# Patient Record
Sex: Female | Born: 1950
Health system: Southern US, Community
[De-identification: ages and names within clinical notes are randomized; demographics above are authoritative.]

## PROBLEM LIST (undated history)

## (undated) DIAGNOSIS — I10 Essential (primary) hypertension: Secondary | ICD-10-CM

## (undated) DIAGNOSIS — M1990 Unspecified osteoarthritis, unspecified site: Secondary | ICD-10-CM

## (undated) DIAGNOSIS — E785 Hyperlipidemia, unspecified: Secondary | ICD-10-CM

## (undated) DIAGNOSIS — R0600 Dyspnea, unspecified: Secondary | ICD-10-CM

## (undated) DIAGNOSIS — E079 Disorder of thyroid, unspecified: Secondary | ICD-10-CM

## (undated) DIAGNOSIS — E039 Hypothyroidism, unspecified: Secondary | ICD-10-CM

## (undated) HISTORY — DX: Disorder of thyroid, unspecified: E07.9

## (undated) HISTORY — DX: Hyperlipidemia, unspecified: E78.5

## (undated) HISTORY — DX: Essential (primary) hypertension: I10

---

## 1976-07-08 HISTORY — PX: ABDOMINAL HYSTERECTOMY: SHX81

## 2005-08-15 LAB — HM DEXA SCAN

## 2006-05-25 ENCOUNTER — Emergency Department: Payer: Self-pay | Admitting: Emergency Medicine

## 2007-02-01 ENCOUNTER — Emergency Department: Payer: Self-pay | Admitting: Emergency Medicine

## 2010-08-21 LAB — HM PAP SMEAR

## 2014-09-06 DIAGNOSIS — L719 Rosacea, unspecified: Secondary | ICD-10-CM | POA: Diagnosis not present

## 2014-09-06 DIAGNOSIS — I10 Essential (primary) hypertension: Secondary | ICD-10-CM | POA: Diagnosis not present

## 2015-03-21 ENCOUNTER — Telehealth: Payer: Self-pay | Admitting: Unknown Physician Specialty

## 2015-03-21 NOTE — Telephone Encounter (Signed)
Pt called, heard it wasn't good to take Aleve with her blood pressure pills.  Would like a call back about this.

## 2015-03-21 NOTE — Telephone Encounter (Signed)
Routing to provider. What can I tell the patient about this?

## 2015-03-22 NOTE — Telephone Encounter (Signed)
Alyssa Bryant is out of the office You can let her know that she has heard correctly; NSAIDs like Aleve can increase blood pressure Tylenol (acetaminophen) does not affect blood pressure and can be take safely per package directions as long as she doesn't have liver disease or drink more than 7 alcoholic drinks per week

## 2015-03-23 NOTE — Telephone Encounter (Signed)
Called and spoke to a man who said Joetta was not at home. I asked him to please have her give me a call back.

## 2015-03-29 NOTE — Telephone Encounter (Signed)
Tried to call patient. Phone rang but then stopped and no voicemail was available. Will try again later.

## 2015-03-30 NOTE — Telephone Encounter (Signed)
Tried to call patient but the phone just kept ringing. No voicemail was available.

## 2015-04-18 ENCOUNTER — Other Ambulatory Visit: Payer: Self-pay

## 2015-04-18 MED ORDER — LEVOTHYROXINE SODIUM 100 MCG PO TABS: 100.0000 ug | ORAL_TABLET | Freq: Every day | ORAL | Status: DC

## 2015-04-18 MED ORDER — LISINOPRIL-HYDROCHLOROTHIAZIDE 20-12.5 MG PO TABS: 2.0000 | ORAL_TABLET | Freq: Every day | ORAL | Status: DC

## 2015-04-18 NOTE — Telephone Encounter (Signed)
Patient was last seen 09/06/14, practice partner number is [phone], and pharmacy is [facility] on [address].

## 2015-04-18 NOTE — Telephone Encounter (Signed)
Needs seen further refills 

## 2015-04-19 NOTE — Telephone Encounter (Signed)
Tried to call patient but there was no answer and no voicemail available. Will try again later.

## 2015-04-19 NOTE — Telephone Encounter (Signed)
Tried to call patient and phone rang but then stopped. So I will try to call again later.

## 2015-04-24 NOTE — Telephone Encounter (Signed)
Tried to call patient twice and the phone rings a couple of times but then stops. No voicemail available.

## 2015-06-22 ENCOUNTER — Other Ambulatory Visit: Payer: Self-pay | Admitting: Unknown Physician Specialty

## 2015-06-22 NOTE — Telephone Encounter (Signed)
Pt needs check further refills 

## 2015-07-25 ENCOUNTER — Other Ambulatory Visit: Payer: Self-pay | Admitting: Unknown Physician Specialty

## 2015-08-29 ENCOUNTER — Other Ambulatory Visit: Payer: Self-pay | Admitting: Unknown Physician Specialty

## 2015-08-30 ENCOUNTER — Other Ambulatory Visit: Payer: Self-pay | Admitting: Unknown Physician Specialty

## 2015-08-31 ENCOUNTER — Other Ambulatory Visit: Payer: Self-pay

## 2015-08-31 NOTE — Telephone Encounter (Signed)
Pt's husband made appt for her 09/06/15 @ 3:30.  Would like meds called in to pharmacy on file.

## 2015-08-31 NOTE — Telephone Encounter (Signed)
Called and left patient a voicemail asking for her to please return my call.  

## 2015-08-31 NOTE — Telephone Encounter (Signed)
Patient's husband called asking about patient's medication refills. He is on HIPPA so I let him know that she needed to make an appointment in order to get more refills. He stated she was at work but gave me a phone number to call her at. So I will call and try to schedule the patient an appointment.

## 2015-09-01 MED ORDER — LISINOPRIL-HYDROCHLOROTHIAZIDE 20-12.5 MG PO TABS: 2.0000 | ORAL_TABLET | Freq: Every day | ORAL | Status: DC

## 2015-09-01 MED ORDER — LEVOTHYROXINE SODIUM 100 MCG PO TABS: 100.0000 ug | ORAL_TABLET | Freq: Every day | ORAL | Status: DC

## 2015-09-01 NOTE — Telephone Encounter (Signed)
Routing to provider  

## 2015-09-05 DIAGNOSIS — E039 Hypothyroidism, unspecified: Secondary | ICD-10-CM | POA: Insufficient documentation

## 2015-09-05 DIAGNOSIS — I10 Essential (primary) hypertension: Secondary | ICD-10-CM | POA: Insufficient documentation

## 2015-09-05 DIAGNOSIS — M8580 Other specified disorders of bone density and structure, unspecified site: Secondary | ICD-10-CM | POA: Insufficient documentation

## 2015-09-05 DIAGNOSIS — E782 Mixed hyperlipidemia: Secondary | ICD-10-CM | POA: Insufficient documentation

## 2015-09-06 ENCOUNTER — Ambulatory Visit: Payer: Self-pay | Admitting: Unknown Physician Specialty

## 2015-09-12 ENCOUNTER — Ambulatory Visit (INDEPENDENT_AMBULATORY_CARE_PROVIDER_SITE_OTHER): Payer: Medicare Other | Admitting: Unknown Physician Specialty

## 2015-09-12 ENCOUNTER — Encounter: Payer: Self-pay | Admitting: Unknown Physician Specialty

## 2015-09-12 VITALS — BP 129/70 | HR 62 | Temp 97.9°F | Ht 62.0 in | Wt 188.6 lb

## 2015-09-12 DIAGNOSIS — E038 Other specified hypothyroidism: Secondary | ICD-10-CM

## 2015-09-12 DIAGNOSIS — Z23 Encounter for immunization: Secondary | ICD-10-CM | POA: Diagnosis not present

## 2015-09-12 DIAGNOSIS — I10 Essential (primary) hypertension: Secondary | ICD-10-CM

## 2015-09-12 LAB — MICROALBUMIN, URINE WAIVED
CREATININE, URINE WAIVED: 200 mg/dL (ref 10–300)
MICROALB, UR WAIVED: 30 mg/L — AB (ref 0–19)

## 2015-09-12 MED ORDER — LISINOPRIL-HYDROCHLOROTHIAZIDE 20-12.5 MG PO TABS: 2.0000 | ORAL_TABLET | Freq: Every day | ORAL | Status: DC

## 2015-09-12 MED ORDER — LEVOTHYROXINE SODIUM 100 MCG PO TABS: 100.0000 ug | ORAL_TABLET | Freq: Every day | ORAL | Status: DC

## 2015-09-12 NOTE — Assessment & Plan Note (Signed)
Stable, continue present medications.  Check TSH 

## 2015-09-12 NOTE — Progress Notes (Signed)
   BP 129/70 mmHg  Pulse 62  Temp(Src) 97.9 F (36.6 C)  Ht 5\' 2"  (1.575 m)  Wt 188 lb 9.6 oz (85.548 kg)  BMI 34.49 kg/m2  SpO2 97%  LMP  (LMP Unknown)   Subjective:    Patient ID: Alyssa Bryant, female    DOB: 1950/08/31, 65 y.o.   MRN: [id number]  HPI: Alyssa Bryant is a 65 y.o. female  Chief Complaint  Patient presents with  . Hypertension  . Hypothyroidism   Hypertension Using medications without difficulty Average home BP Not checking  No problems or lightheadedness No chest pain with exertion or shortness of breath No Edema  Hypothyroid No weight changes or changes in energy levels.    Pt currently having a lot of caregiver stress with caring for multiple family members.  +  Relevant past medical, surgical, family and social history reviewed and updated as indicated. Interim medical history since our last visit reviewed. Allergies and medications reviewed and updated.  Review of Systems  Per HPI unless specifically indicated above     Objective:    BP 129/70 mmHg  Pulse 62  Temp(Src) 97.9 F (36.6 C)  Ht 5\' 2"  (1.575 m)  Wt 188 lb 9.6 oz (85.548 kg)  BMI 34.49 kg/m2  SpO2 97%  LMP  (LMP Unknown)  Wt Readings from Last 3 Encounters:  09/12/15 188 lb 9.6 oz (85.548 kg)  09/06/14 193 lb (87.544 kg)    Physical Exam  Constitutional: She is oriented to person, place, and time. She appears well-developed and well-nourished. No distress.  HENT:  Head: Normocephalic and atraumatic.  Eyes: Conjunctivae and lids are normal. Right eye exhibits no discharge. Left eye exhibits no discharge. No scleral icterus.  Neck: Normal range of motion. Neck supple. No JVD present. Carotid bruit is not present.  Cardiovascular: Normal rate, regular rhythm and normal heart sounds.   Pulmonary/Chest: Effort normal and breath sounds normal.  Abdominal: Normal appearance. There is no splenomegaly or hepatomegaly.  Musculoskeletal: Normal range of motion.  Neurological: She is  alert and oriented to person, place, and time.  Skin: Skin is warm, dry and intact. No rash noted. No pallor.  Psychiatric: She has a normal mood and affect. Her behavior is normal. Judgment and thought content normal.    Results for orders placed or performed in visit on 09/05/15  HM DEXA SCAN  Result Value Ref Range   HM Dexa Scan from PP   HM PAP SMEAR  Result Value Ref Range   HM Pap smear from PP       Assessment & Plan:   Problem List Items Addressed This Visit      Unprioritized   Hypothyroidism    Stable, continue present medications. Check TSH       Hypertension    Stable, continue present medications.         Other Visit Diagnoses    Need for pneumococcal vaccination    -  Primary    Relevant Orders    Pneumococcal conjugate vaccine 13-valent IM (Completed)        Follow up plan: Return in about 6 months (around 03/14/2016) for physical.

## 2015-09-12 NOTE — Assessment & Plan Note (Signed)
Stable, continue present medications.   

## 2015-09-13 ENCOUNTER — Telehealth: Payer: Self-pay | Admitting: Unknown Physician Specialty

## 2015-09-13 LAB — COMPREHENSIVE METABOLIC PANEL
A/G RATIO: 1.5 (ref 1.1–2.5)
ALT: 25 IU/L (ref 0–32)
AST: 22 IU/L (ref 0–40)
Albumin: 4.1 g/dL (ref 3.6–4.8)
Alkaline Phosphatase: 88 IU/L (ref 39–117)
BUN/Creatinine Ratio: 29 — ABNORMAL HIGH (ref 11–26)
BUN: 25 mg/dL (ref 8–27)
CALCIUM: 9.2 mg/dL (ref 8.7–10.3)
CHLORIDE: 100 mmol/L (ref 96–106)
CO2: 22 mmol/L (ref 18–29)
Creatinine, Ser: 0.87 mg/dL (ref 0.57–1.00)
GFR, EST AFRICAN AMERICAN: 80 mL/min/{1.73_m2} (ref >59)
GFR, EST NON AFRICAN AMERICAN: 70 mL/min/{1.73_m2} (ref >59)
GLOBULIN, TOTAL: 2.7 g/dL (ref 1.5–4.5)
Glucose: 87 mg/dL (ref 65–99)
POTASSIUM: 4.5 mmol/L (ref 3.5–5.2)
SODIUM: 139 mmol/L (ref 134–144)
TOTAL PROTEIN: 6.8 g/dL (ref 6.0–8.5)

## 2015-09-13 LAB — TSH: TSH: 11.95 u[IU]/mL — ABNORMAL HIGH (ref 0.450–4.500)

## 2015-09-13 LAB — URIC ACID: URIC ACID: 6.2 mg/dL (ref 2.5–7.1)

## 2015-09-13 NOTE — Telephone Encounter (Signed)
Pt called stated she is returning Cheryl's call. Please call her back ASAP. Thanks.

## 2015-09-13 NOTE — Telephone Encounter (Signed)
Routing to provider  

## 2015-09-13 NOTE — Telephone Encounter (Signed)
Left message with patient about elevated TSH.  ? About compliance.  If she is compliant, will need to increase dose, if not, will stress compliance

## 2015-09-13 NOTE — Telephone Encounter (Signed)
Discussed with pt elevated TSH. She had been out of her medications.  Recheck at next visit and continue same medication at the same dose.

## 2015-10-02 ENCOUNTER — Telehealth: Payer: Self-pay | Admitting: Unknown Physician Specialty

## 2015-10-02 NOTE — Telephone Encounter (Signed)
Erroneous entry

## 2015-10-04 ENCOUNTER — Other Ambulatory Visit: Payer: Self-pay | Admitting: Unknown Physician Specialty

## 2015-10-05 NOTE — Telephone Encounter (Signed)
Your patient 

## 2015-10-06 ENCOUNTER — Ambulatory Visit: Payer: Medicare Other | Admitting: Unknown Physician Specialty

## 2016-01-01 ENCOUNTER — Ambulatory Visit (INDEPENDENT_AMBULATORY_CARE_PROVIDER_SITE_OTHER): Payer: Medicare Other | Admitting: Unknown Physician Specialty

## 2016-01-01 ENCOUNTER — Encounter: Payer: Self-pay | Admitting: Unknown Physician Specialty

## 2016-01-01 VITALS — BP 125/76 | HR 78 | Temp 97.4°F | Ht 61.1 in | Wt 190.0 lb

## 2016-01-01 DIAGNOSIS — L84 Corns and callosities: Secondary | ICD-10-CM

## 2016-01-01 DIAGNOSIS — B353 Tinea pedis: Secondary | ICD-10-CM | POA: Diagnosis not present

## 2016-01-01 MED ORDER — CLOTRIMAZOLE-BETAMETHASONE 1-0.05 % EX CREA: 1.0000 "application " | TOPICAL_CREAM | Freq: Two times a day (BID) | CUTANEOUS | Status: DC

## 2016-01-01 NOTE — Progress Notes (Signed)
BP 125/76 mmHg  Pulse 78  Temp(Src) 97.4 F (36.3 C)  Ht 5' 1.1" (1.552 m)  Wt 190 lb (86.183 kg)  BMI 35.78 kg/m2  SpO2 94%  LMP  (LMP Unknown)   Subjective:    Patient ID: Alyssa Bryant, female    DOB: February 17, 1951, 65 y.o.   MRN: [id number]  HPI: Alyssa Bryant is a 65 y.o. female  Chief Complaint  Patient presents with  . Rash    pt states she has a rash on right foot that started about 2 weeks ago. states it itches.    Scaly and itchy rash that is between 1 and 2nd and 2nd and 3rd toe.  Severity of rash comes and goes.  She is not using anything for it. Pt has a raised area left large toe.      Relevant past medical, surgical, family and social history reviewed and updated as indicated. Interim medical history since our last visit reviewed. Allergies and medications reviewed and updated.  Review of Systems  Per HPI unless specifically indicated above     Objective:    BP 125/76 mmHg  Pulse 78  Temp(Src) 97.4 F (36.3 C)  Ht 5' 1.1" (1.552 m)  Wt 190 lb (86.183 kg)  BMI 35.78 kg/m2  SpO2 94%  LMP  (LMP Unknown)  Wt Readings from Last 3 Encounters:  01/01/16 190 lb (86.183 kg)  09/12/15 188 lb 9.6 oz (85.548 kg)  09/06/14 193 lb (87.544 kg)    Physical Exam  Constitutional: She is oriented to person, place, and time. She appears well-developed and well-nourished. No distress.  HENT:  Head: Normocephalic and atraumatic.  Eyes: Conjunctivae and lids are normal. Right eye exhibits no discharge. Left eye exhibits no discharge. No scleral icterus.  Cardiovascular: Normal rate.   Pulmonary/Chest: Effort normal.  Abdominal: Normal appearance. There is no splenomegaly or hepatomegaly.  Musculoskeletal: Normal range of motion.  Neurological: She is alert and oriented to person, place, and time.  Skin: Skin is intact. No rash noted. No pallor.  Psychiatric: She has a normal mood and affect. Her behavior is normal. Judgment and thought content normal.    Results for  orders placed or performed in visit on 09/12/15  Comprehensive metabolic panel  Result Value Ref Range   Glucose 87 65 - 99 mg/dL   BUN 25 8 - 27 mg/dL   Creatinine, Ser 0.87 0.57 - 1.00 mg/dL   GFR calc non Af Amer 70 >59 mL/min/1.73   GFR calc Af Amer 80 >59 mL/min/1.73   BUN/Creatinine Ratio 29 (H) 11 - 26   Sodium 139 134 - 144 mmol/L   Potassium 4.5 3.5 - 5.2 mmol/L   Chloride 100 96 - 106 mmol/L   CO2 22 18 - 29 mmol/L   Calcium 9.2 8.7 - 10.3 mg/dL   Total Protein 6.8 6.0 - 8.5 g/dL   Albumin 4.1 3.6 - 4.8 g/dL   Globulin, Total 2.7 1.5 - 4.5 g/dL   Albumin/Globulin Ratio 1.5 1.1 - 2.5   Bilirubin Total <0.2 0.0 - 1.2 mg/dL   Alkaline Phosphatase 88 39 - 117 IU/L   AST 22 0 - 40 IU/L   ALT 25 0 - 32 IU/L  TSH  Result Value Ref Range   TSH 11.950 (H) 0.450 - 4.500 uIU/mL  Microalbumin, Urine Waived  Result Value Ref Range   Microalb, Ur Waived 30 (H) 0 - 19 mg/L   Creatinine, Urine Waived 200 10 - 300 mg/dL  Microalb/Creat Ratio <30 <30 mg/g  Uric acid  Result Value Ref Range   Uric Acid 6.2 2.5 - 7.1 mg/dL      Assessment & Plan:   Problem List Items Addressed This Visit    None    Visit Diagnoses    Corn    -  Primary    Pt ed on OTC care    Tinea pedis of right foot        Lotrosone cream    Relevant Medications    clotrimazole-betamethasone (LOTRISONE) cream        Follow up plan: Return if symptoms worsen or fail to improve.

## 2016-02-29 ENCOUNTER — Encounter (INDEPENDENT_AMBULATORY_CARE_PROVIDER_SITE_OTHER): Payer: Self-pay

## 2016-03-26 ENCOUNTER — Encounter: Payer: Medicare Other | Admitting: Unknown Physician Specialty

## 2016-04-11 ENCOUNTER — Telehealth: Payer: Self-pay | Admitting: Unknown Physician Specialty

## 2016-04-11 ENCOUNTER — Other Ambulatory Visit: Payer: Self-pay | Admitting: Unknown Physician Specialty

## 2016-04-11 MED ORDER — LEVOTHYROXINE SODIUM 100 MCG PO TABS: 100.0000 ug | ORAL_TABLET | Freq: Every day | ORAL | 0 refills | Status: DC

## 2016-04-11 NOTE — Telephone Encounter (Signed)
Sent in 30 days, but she is overdue for follow up to check levels. Noted that she does have physical scheduled for November.

## 2016-04-11 NOTE — Telephone Encounter (Signed)
Pt's husband called stated pt needs a refill on Levothyroxine. Pharm is [facility] [address]. Pt is completely out of this medication. Thanks.

## 2016-04-11 NOTE — Telephone Encounter (Signed)
Routing to provider.  Cheryl patient, but she's out of her med completely.

## 2016-05-08 ENCOUNTER — Other Ambulatory Visit: Payer: Self-pay | Admitting: Family Medicine

## 2016-05-08 ENCOUNTER — Other Ambulatory Visit: Payer: Self-pay | Admitting: Unknown Physician Specialty

## 2016-05-08 NOTE — Telephone Encounter (Signed)
Routing to provider  

## 2016-05-21 ENCOUNTER — Encounter (INDEPENDENT_AMBULATORY_CARE_PROVIDER_SITE_OTHER): Payer: Self-pay

## 2016-06-04 ENCOUNTER — Encounter: Payer: Medicare Other | Admitting: Unknown Physician Specialty

## 2016-06-10 ENCOUNTER — Other Ambulatory Visit: Payer: Self-pay | Admitting: Unknown Physician Specialty

## 2016-06-10 NOTE — Telephone Encounter (Signed)
Pharmacy wants to know if it is OK to change the manufacture of the synthroid?

## 2016-06-12 ENCOUNTER — Telehealth: Payer: Self-pay | Admitting: Unknown Physician Specialty

## 2016-06-12 NOTE — Telephone Encounter (Signed)
Pt called needs a refill on Levothyroxine. Pt took her last pill this morning. Pharm is [facility] on [address] Thanks.

## 2016-06-12 NOTE — Telephone Encounter (Signed)
Medication has already been sent in this morning so I called and left the patient a VM letting her know that it was already sent in.

## 2016-07-10 ENCOUNTER — Encounter: Payer: Self-pay | Admitting: Unknown Physician Specialty

## 2016-07-10 ENCOUNTER — Ambulatory Visit (INDEPENDENT_AMBULATORY_CARE_PROVIDER_SITE_OTHER): Payer: Medicare Other | Admitting: Unknown Physician Specialty

## 2016-07-10 ENCOUNTER — Telehealth: Payer: Self-pay | Admitting: Unknown Physician Specialty

## 2016-07-10 DIAGNOSIS — M722 Plantar fascial fibromatosis: Secondary | ICD-10-CM | POA: Insufficient documentation

## 2016-07-10 MED ORDER — NAPROXEN 500 MG PO TABS: 500.0000 mg | ORAL_TABLET | Freq: Two times a day (BID) | ORAL | 0 refills | Status: DC

## 2016-07-10 MED ORDER — LEVOTHYROXINE SODIUM 100 MCG PO TABS: 100.0000 ug | ORAL_TABLET | Freq: Every day | ORAL | 1 refills | Status: DC

## 2016-07-10 NOTE — Progress Notes (Signed)
BP (!) 143/68 (BP Location: Left Arm, Patient Position: Sitting, Cuff Size: Large)   Pulse 86   Temp 98.5 F (36.9 C)   Wt 191 lb (86.6 kg)   LMP  (LMP Unknown)   SpO2 95%   BMI 35.97 kg/m    Subjective:    Patient ID: Alyssa Bryant, female    DOB: Nov 26, 1950, 66 y.o.   MRN: [id number]  HPI: Alyssa Bryant is a 66 y.o. female  Chief Complaint  Patient presents with  . Foot Pain    pt states her left foot has been bothering her for about a week. States she wore different shoes one day last week and made both feet hurt. Pt states the right foot stopped hurting but the left kept hurting, and the pain is mainly in the heel area  . Medication Refill    pt states she needs a refill on levothyroxine, has a physical scheduled 09/03/16   Foot pain Pt is here with left foot pain around the heel.  Most pain with first step of the day.  Also painful after standing for a period of time.   Relevant past medical, surgical, family and social history reviewed and updated as indicated. Interim medical history since our last visit reviewed. Allergies and medications reviewed and updated.  Review of Systems  Per HPI unless specifically indicated above     Objective:    BP (!) 143/68 (BP Location: Left Arm, Patient Position: Sitting, Cuff Size: Large)   Pulse 86   Temp 98.5 F (36.9 C)   Wt 191 lb (86.6 kg)   LMP  (LMP Unknown)   SpO2 95%   BMI 35.97 kg/m   Wt Readings from Last 3 Encounters:  07/10/16 191 lb (86.6 kg)  01/01/16 190 lb (86.2 kg)  09/12/15 188 lb 9.6 oz (85.5 kg)    Physical Exam  Results for orders placed or performed in visit on 09/12/15  Comprehensive metabolic panel  Result Value Ref Range   Glucose 87 65 - 99 mg/dL   BUN 25 8 - 27 mg/dL   Creatinine, Ser 0.87 0.57 - 1.00 mg/dL   GFR calc non Af Amer 70 >59 mL/min/1.73   GFR calc Af Amer 80 >59 mL/min/1.73   BUN/Creatinine Ratio 29 (H) 11 - 26   Sodium 139 134 - 144 mmol/L   Potassium 4.5 3.5 - 5.2 mmol/L   Chloride 100 96 - 106 mmol/L   CO2 22 18 - 29 mmol/L   Calcium 9.2 8.7 - 10.3 mg/dL   Total Protein 6.8 6.0 - 8.5 g/dL   Albumin 4.1 3.6 - 4.8 g/dL   Globulin, Total 2.7 1.5 - 4.5 g/dL   Albumin/Globulin Ratio 1.5 1.1 - 2.5   Bilirubin Total <0.2 0.0 - 1.2 mg/dL   Alkaline Phosphatase 88 39 - 117 IU/L   AST 22 0 - 40 IU/L   ALT 25 0 - 32 IU/L  TSH  Result Value Ref Range   TSH 11.950 (H) 0.450 - 4.500 uIU/mL  Microalbumin, Urine Waived  Result Value Ref Range   Microalb, Ur Waived 30 (H) 0 - 19 mg/L   Creatinine, Urine Waived 200 10 - 300 mg/dL   Microalb/Creat Ratio <30 <30 mg/g  Uric acid  Result Value Ref Range   Uric Acid 6.2 2.5 - 7.1 mg/dL  --+++++++++    Assessment & Plan:   Problem List Items Addressed This Visit      Unprioritized   Plantar fasciitis  Naprosyn.  Pt ed.            Follow up plan: Return for PE next mnths.

## 2016-07-10 NOTE — Assessment & Plan Note (Signed)
Naprosyn.  Pt ed.

## 2016-07-10 NOTE — Patient Instructions (Addendum)
Plantar Fasciitis Plantar fasciitis is a painful foot condition that affects the heel. It occurs when the band of tissue that connects the toes to the heel bone (plantar fascia) becomes irritated. This can happen after exercising too much or doing other repetitive activities (overuse injury). The pain from plantar fasciitis can range from mild irritation to severe pain that makes it difficult for you to walk or move. The pain is usually worse in the morning or after you have been sitting or lying down for a while. CAUSES This condition may be caused by:  Standing for long periods of time.  Wearing shoes that do not fit.  Doing high-impact activities, including running, aerobics, and ballet.  Being overweight.  Having an abnormal way of walking (gait).  Having tight calf muscles.  Having high arches in your feet.  Starting a new athletic activity. SYMPTOMS The main symptom of this condition is heel pain. Other symptoms include:  Pain that gets worse after activity or exercise.  Pain that is worse in the morning or after resting.  Pain that goes away after you walk for a few minutes. DIAGNOSIS This condition may be diagnosed based on your signs and symptoms. Your health care provider will also do a physical exam to check for:  A tender area on the bottom of your foot.  A high arch in your foot.  Pain when you move your foot.  Difficulty moving your foot. You may also need to have imaging studies to confirm the diagnosis. These can include:  X-rays.  Ultrasound.  MRI. TREATMENT  Treatment for plantar fasciitis depends on the severity of the condition. Your treatment may include:  Rest, ice, and over-the-counter pain medicines to manage your pain.  Exercises to stretch your calves and your plantar fascia.  A splint that holds your foot in a stretched, upward position while you sleep (night splint).  Physical therapy to relieve symptoms and prevent problems in the  future.  Cortisone injections to relieve severe pain.  Extracorporeal shock wave therapy (ESWT) to stimulate damaged plantar fascia with electrical impulses. It is often used as a last resort before surgery.  Surgery, if other treatments have not worked after 12 months. HOME CARE INSTRUCTIONS  Take medicines only as directed by your health care provider.  Avoid activities that cause pain.  Roll the bottom of your foot over a bag of ice or a bottle of cold water. Do this for 20 minutes, 3-4 times a day.  Perform simple stretches as directed by your health care provider.  Try wearing athletic shoes with air-sole or gel-sole cushions or soft shoe inserts.  Wear a night splint while sleeping, if directed by your health care provider.  Keep all follow-up appointments with your health care provider. PREVENTION   Do not perform exercises or activities that cause heel pain.  Consider finding low-impact activities if you continue to have problems.  Lose weight if you need to. The best way to prevent plantar fasciitis is to avoid the activities that aggravate your plantar fascia. SEEK MEDICAL CARE IF:  Your symptoms do not go away after treatment with home care measures.  Your pain gets worse.  Your pain affects your ability to move or do your daily activities. This information is not intended to replace advice given to you by your health care provider. Make sure you discuss any questions you have with your health care provider. ------------------------------------------------------------- Tame Magnesium supplements - twice a day Heel cups initially and then arch  supports for your shoes

## 2016-07-16 NOTE — Telephone Encounter (Signed)
ERROR

## 2016-09-03 ENCOUNTER — Ambulatory Visit (INDEPENDENT_AMBULATORY_CARE_PROVIDER_SITE_OTHER): Payer: Medicare Other | Admitting: Unknown Physician Specialty

## 2016-09-03 ENCOUNTER — Encounter: Payer: Self-pay | Admitting: Unknown Physician Specialty

## 2016-09-03 VITALS — BP 118/76 | HR 68 | Temp 98.2°F | Ht 62.5 in | Wt 186.4 lb

## 2016-09-03 DIAGNOSIS — I10 Essential (primary) hypertension: Secondary | ICD-10-CM

## 2016-09-03 DIAGNOSIS — Z7189 Other specified counseling: Secondary | ICD-10-CM | POA: Insufficient documentation

## 2016-09-03 DIAGNOSIS — E038 Other specified hypothyroidism: Secondary | ICD-10-CM | POA: Diagnosis not present

## 2016-09-03 DIAGNOSIS — Z0000 Encounter for general adult medical examination without abnormal findings: Secondary | ICD-10-CM

## 2016-09-03 DIAGNOSIS — Z1239 Encounter for other screening for malignant neoplasm of breast: Secondary | ICD-10-CM

## 2016-09-03 NOTE — Patient Instructions (Signed)
Please do call to schedule your mammogram; the number to schedule one at either [facility] or [facility] is [phone]   

## 2016-09-03 NOTE — Progress Notes (Signed)
BP 118/76 (BP Location: Left Arm, Patient Position: Sitting, Cuff Size: Large)   Pulse 68   Temp 98.2 F (36.8 C)   Ht 5' 2.5" (1.588 m)   Wt 186 lb 6.4 oz (84.6 kg)   LMP  (LMP Unknown)   SpO2 96%   BMI 33.55 kg/m    Subjective:    Patient ID: Alyssa Bryant, female    DOB: Jul 07, 1951, 66 y.o.   MRN: [id number]  HPI: Alyssa Bryant is a 66 y.o. female  Chief Complaint  Patient presents with  . Medicare Wellness   Hypertension Using medications without difficulty Average home BPs Not checking   No problems or lightheadedness No chest pain with exertion or shortness of breath No Edema  Hypothyroid Doing well on her present medications.    Depression screen [facility] 2/9 09/03/2016 09/12/2015  Decreased Interest 1 0  Down, Depressed, Hopeless 3 1  PHQ - 2 Score 4 1  Altered sleeping 0 -  Tired, decreased energy 0 -  Change in appetite 0 -  Feeling bad or failure about yourself  0 -  Trouble concentrating 0 -  Moving slowly or fidgety/restless 0 -  Suicidal thoughts 0 -  PHQ-9 Score 4 -   Functional Status Survey: Is the patient deaf or have difficulty hearing?: No Does the patient have difficulty seeing, even when wearing glasses/contacts?: No Does the patient have difficulty concentrating, remembering, or making decisions?: No Does the patient have difficulty walking or climbing stairs?: No Does the patient have difficulty dressing or bathing?: No Does the patient have difficulty doing errands alone such as visiting a doctor's office or shopping?: No Fall Risk  09/03/2016  Falls in the past year? No    Relevant past medical, surgical, family and social history reviewed and updated as indicated. Interim medical history since our last visit reviewed. Allergies and medications reviewed and updated.  Review of Systems  Constitutional: Negative.   HENT: Negative.   Eyes: Negative.   Respiratory: Negative.   Cardiovascular: Negative.   Gastrointestinal: Negative.     Endocrine: Negative.   Genitourinary: Negative.   Musculoskeletal: Negative.   Skin: Negative.   Allergic/Immunologic: Negative.   Neurological: Negative.   Hematological: Negative.   Psychiatric/Behavioral: Negative.     Per HPI unless specifically indicated above     Objective:    BP 118/76 (BP Location: Left Arm, Patient Position: Sitting, Cuff Size: Large)   Pulse 68   Temp 98.2 F (36.8 C)   Ht 5' 2.5" (1.588 m)   Wt 186 lb 6.4 oz (84.6 kg)   LMP  (LMP Unknown)   SpO2 96%   BMI 33.55 kg/m   Wt Readings from Last 3 Encounters:  09/03/16 186 lb 6.4 oz (84.6 kg)  07/10/16 191 lb (86.6 kg)  01/01/16 190 lb (86.2 kg)    Physical Exam  Constitutional: She is oriented to person, place, and time. She appears well-developed and well-nourished.  HENT:  Head: Normocephalic and atraumatic.  Eyes: Pupils are equal, round, and reactive to light. Right eye exhibits no discharge. Left eye exhibits no discharge. No scleral icterus.  Neck: Normal range of motion. Neck supple. Carotid bruit is not present. No thyromegaly present.  Cardiovascular: Normal rate, regular rhythm and normal heart sounds.  Exam reveals no gallop and no friction rub.   No murmur heard. Pulmonary/Chest: Effort normal and breath sounds normal. No respiratory distress. She has no wheezes. She has no rales.  Abdominal: Soft. Bowel sounds are normal.  There is no tenderness. There is no rebound.  Genitourinary: No breast swelling, tenderness or discharge.  Musculoskeletal: Normal range of motion.  Lymphadenopathy:    She has no cervical adenopathy.  Neurological: She is alert and oriented to person, place, and time.  Skin: Skin is warm, dry and intact. No rash noted.  Psychiatric: She has a normal mood and affect. Her speech is normal and behavior is normal. Judgment and thought content normal. Cognition and memory are normal.    Results for orders placed or performed in visit on 09/12/15  Comprehensive  metabolic panel  Result Value Ref Range   Glucose 87 65 - 99 mg/dL   BUN 25 8 - 27 mg/dL   Creatinine, Ser 0.87 0.57 - 1.00 mg/dL   GFR calc non Af Amer 70 >59 mL/min/1.73   GFR calc Af Amer 80 >59 mL/min/1.73   BUN/Creatinine Ratio 29 (H) 11 - 26   Sodium 139 134 - 144 mmol/L   Potassium 4.5 3.5 - 5.2 mmol/L   Chloride 100 96 - 106 mmol/L   CO2 22 18 - 29 mmol/L   Calcium 9.2 8.7 - 10.3 mg/dL   Total Protein 6.8 6.0 - 8.5 g/dL   Albumin 4.1 3.6 - 4.8 g/dL   Globulin, Total 2.7 1.5 - 4.5 g/dL   Albumin/Globulin Ratio 1.5 1.1 - 2.5   Bilirubin Total <0.2 0.0 - 1.2 mg/dL   Alkaline Phosphatase 88 39 - 117 IU/L   AST 22 0 - 40 IU/L   ALT 25 0 - 32 IU/L  TSH  Result Value Ref Range   TSH 11.950 (H) 0.450 - 4.500 uIU/mL  Microalbumin, Urine Waived  Result Value Ref Range   Microalb, Ur Waived 30 (H) 0 - 19 mg/L   Creatinine, Urine Waived 200 10 - 300 mg/dL   Microalb/Creat Ratio <30 <30 mg/g  Uric acid  Result Value Ref Range   Uric Acid 6.2 2.5 - 7.1 mg/dL      Assessment & Plan:   Problem List Items Addressed This Visit      Unprioritized   Advanced care planning/counseling discussion    A voluntary discussion about advance care planning including the explanation and discussion of advance directives was extensively discussed  with the patient.  Explanation about the health care proxy and Living will was reviewed and packet with forms and short form filled out with patient.  During this discussion, the patient was able to identify a health care proxy as hr grandson and plans to have the paperwork notarized.        Hypertension    Stable, continue present medications.        Relevant Orders   Comprehensive metabolic panel   Lipid Panel w/o Chol/HDL Ratio   Hypothyroidism    Check TSH      Relevant Orders   TSH       Follow up plan: No Follow-up on file.

## 2016-09-03 NOTE — Assessment & Plan Note (Addendum)
A voluntary discussion about advance care planning including the explanation and discussion of advance directives was extensively discussed  with the patient.  Explanation about the health care proxy and Living will was reviewed and packet with forms and short form filled out with patient.  During this discussion, the patient was able to identify a health care proxy as hr grandson and plans to have the paperwork notarized.  Time of visit 30 minutes

## 2016-09-03 NOTE — Assessment & Plan Note (Signed)
Check TSH 

## 2016-09-03 NOTE — Assessment & Plan Note (Signed)
Stable, continue present medications.   

## 2016-09-04 ENCOUNTER — Encounter: Payer: Self-pay | Admitting: Unknown Physician Specialty

## 2016-09-04 LAB — TSH: TSH: 6.34 u[IU]/mL — AB (ref 0.450–4.500)

## 2016-09-04 LAB — COMPREHENSIVE METABOLIC PANEL
A/G RATIO: 1.6 (ref 1.2–2.2)
ALK PHOS: 89 IU/L (ref 39–117)
ALT: 17 IU/L (ref 0–32)
AST: 18 IU/L (ref 0–40)
Albumin: 4.6 g/dL (ref 3.6–4.8)
BUN/Creatinine Ratio: 44 — ABNORMAL HIGH (ref 12–28)
BUN: 35 mg/dL — ABNORMAL HIGH (ref 8–27)
Bilirubin Total: 0.2 mg/dL (ref 0.0–1.2)
CALCIUM: 9.6 mg/dL (ref 8.7–10.3)
CO2: 23 mmol/L (ref 18–29)
CREATININE: 0.8 mg/dL (ref 0.57–1.00)
Chloride: 98 mmol/L (ref 96–106)
GFR calc Af Amer: 88 mL/min/{1.73_m2} (ref >59)
GFR, EST NON AFRICAN AMERICAN: 77 mL/min/{1.73_m2} (ref >59)
GLOBULIN, TOTAL: 2.8 g/dL (ref 1.5–4.5)
Glucose: 83 mg/dL (ref 65–99)
POTASSIUM: 4.3 mmol/L (ref 3.5–5.2)
SODIUM: 138 mmol/L (ref 134–144)
Total Protein: 7.4 g/dL (ref 6.0–8.5)

## 2016-09-04 LAB — LIPID PANEL W/O CHOL/HDL RATIO
CHOLESTEROL TOTAL: 227 mg/dL — AB (ref 100–199)
HDL: 38 mg/dL — AB (ref >39)
LDL Calculated: 154 mg/dL — ABNORMAL HIGH (ref 0–99)
TRIGLYCERIDES: 175 mg/dL — AB (ref 0–149)
VLDL Cholesterol Cal: 35 mg/dL (ref 5–40)

## 2016-09-09 ENCOUNTER — Telehealth: Payer: Self-pay | Admitting: Unknown Physician Specialty

## 2016-09-09 NOTE — Telephone Encounter (Signed)
Called and spoke to patient. She questioned about her cholesterol results and wanted to know what they meant. I gave patient the information based on letter that [facility] sent. I explained to patient that her LDL was high and provided suggestions to lower LDL, based on letter. Patient stated that she did not have any other questions.

## 2016-09-09 NOTE — Telephone Encounter (Signed)
I don't see where we talked about this   It is best for me to be able to look at the ear

## 2016-09-09 NOTE — Telephone Encounter (Signed)
Pt called and stated she received her lab results by mail but she still had some questions and would like a call back.

## 2016-09-09 NOTE — Telephone Encounter (Signed)
Called and spoke with patient. She stated that she is having a lot of congestion, a cough she cannot get rid of, and left ear pain. Patient stated that her left ear has been popping. States she used drops, ear wax remover, and some brown stuff and blood came out. Patient would like to know what to do about this.

## 2016-09-10 ENCOUNTER — Encounter: Payer: Self-pay | Admitting: Unknown Physician Specialty

## 2016-09-10 ENCOUNTER — Ambulatory Visit (INDEPENDENT_AMBULATORY_CARE_PROVIDER_SITE_OTHER): Payer: Medicare Other | Admitting: Unknown Physician Specialty

## 2016-09-10 VITALS — BP 109/69 | HR 96 | Temp 97.7°F | Wt 189.4 lb

## 2016-09-10 DIAGNOSIS — H65112 Acute and subacute allergic otitis media (mucoid) (sanguinous) (serous), left ear: Secondary | ICD-10-CM | POA: Diagnosis not present

## 2016-09-10 DIAGNOSIS — J0110 Acute frontal sinusitis, unspecified: Secondary | ICD-10-CM | POA: Diagnosis not present

## 2016-09-10 MED ORDER — AMOXICILLIN 875 MG PO TABS: 875.0000 mg | ORAL_TABLET | Freq: Two times a day (BID) | ORAL | 0 refills | Status: DC

## 2016-09-10 MED ORDER — FLUTICASONE PROPIONATE 50 MCG/ACT NA SUSP: 2.0000 | Freq: Every day | NASAL | 6 refills | Status: DC

## 2016-09-10 NOTE — Progress Notes (Signed)
BP 109/69 (BP Location: Left Arm, Patient Position: Sitting, Cuff Size: Large)   Pulse 96   Temp 97.7 F (36.5 C)   Wt 189 lb 6.4 oz (85.9 kg)   LMP  (LMP Unknown)   SpO2 96%   BMI 34.09 kg/m    Subjective:    Patient ID: Alyssa Bryant, female    DOB: [birth year], 66 y.o.   MRN: [id number]  HPI: Alyssa Bryant is a 66 y.o. female  Chief Complaint  Patient presents with  . Head Congestion    pt states she woke up Thursday and felt like something was running out of left ear and was popping. Pt states that she tried using some drops and brown and white stuff came out of her ear. Patient states that both side near her ears are swollen and that her head is very congested.    Ear pain Pt states her left ear is popping and "stuff running out of it."  She has had significant nasal congestion that has been coming and going for about 8 weeks.  States she is off balance when she stands.  No fever but hard of hearing.    Relevant past medical, surgical, family and social history reviewed and updated as indicated. Interim medical history since our last visit reviewed. Allergies and medications reviewed and updated.  Review of Systems  Per HPI unless specifically indicated above     Objective:    BP 109/69 (BP Location: Left Arm, Patient Position: Sitting, Cuff Size: Large)   Pulse 96   Temp 97.7 F (36.5 C)   Wt 189 lb 6.4 oz (85.9 kg)   LMP  (LMP Unknown)   SpO2 96%   BMI 34.09 kg/m   Wt Readings from Last 3 Encounters:  09/10/16 189 lb 6.4 oz (85.9 kg)  09/03/16 186 lb 6.4 oz (84.6 kg)  07/10/16 191 lb (86.6 kg)    Physical Exam  Constitutional: She is oriented to person, place, and time. She appears well-developed and well-nourished. No distress.  HENT:  Head: Normocephalic and atraumatic.  Right Ear: Tympanic membrane and ear canal normal.  Left Ear: Ear canal normal. A middle ear effusion is present.  Nose: Rhinorrhea present. Right sinus exhibits no maxillary sinus  tenderness and no frontal sinus tenderness. Left sinus exhibits no maxillary sinus tenderness and no frontal sinus tenderness.  Mouth/Throat: Mucous membranes are normal. Posterior oropharyngeal erythema present.  Eyes: Conjunctivae and lids are normal. Right eye exhibits no discharge. Left eye exhibits no discharge. No scleral icterus.  Cardiovascular: Normal rate and regular rhythm.   Pulmonary/Chest: Effort normal and breath sounds normal. No respiratory distress.  Abdominal: Normal appearance. There is no splenomegaly or hepatomegaly.  Musculoskeletal: Normal range of motion.  Neurological: She is alert and oriented to person, place, and time.  Skin: Skin is intact. No rash noted. No pallor.  Psychiatric: She has a normal mood and affect. Her behavior is normal. Judgment and thought content normal.    Results for orders placed or performed in visit on 09/03/16  Comprehensive metabolic panel  Result Value Ref Range   Glucose 83 65 - 99 mg/dL   BUN 35 (H) 8 - 27 mg/dL   Creatinine, Ser 0.80 0.57 - 1.00 mg/dL   GFR calc non Af Amer 77 >59 mL/min/1.73   GFR calc Af Amer 88 >59 mL/min/1.73   BUN/Creatinine Ratio 44 (H) 12 - 28   Sodium 138 134 - 144 mmol/L   Potassium 4.3 3.5 - 5.2  mmol/L   Chloride 98 96 - 106 mmol/L   CO2 23 18 - 29 mmol/L   Calcium 9.6 8.7 - 10.3 mg/dL   Total Protein 7.4 6.0 - 8.5 g/dL   Albumin 4.6 3.6 - 4.8 g/dL   Globulin, Total 2.8 1.5 - 4.5 g/dL   Albumin/Globulin Ratio 1.6 1.2 - 2.2   Bilirubin Total 0.2 0.0 - 1.2 mg/dL   Alkaline Phosphatase 89 39 - 117 IU/L   AST 18 0 - 40 IU/L   ALT 17 0 - 32 IU/L  Lipid Panel w/o Chol/HDL Ratio  Result Value Ref Range   Cholesterol, Total 227 (H) 100 - 199 mg/dL   Triglycerides 175 (H) 0 - 149 mg/dL   HDL 38 (L) >39 mg/dL   VLDL Cholesterol Cal 35 5 - 40 mg/dL   LDL Calculated 154 (H) 0 - 99 mg/dL  TSH  Result Value Ref Range   TSH 6.340 (H) 0.450 - 4.500 uIU/mL      Assessment & Plan:   Problem List Items  Addressed This Visit    None    Visit Diagnoses    Acute allergic serous otitis media of left ear    -  Primary   Relevant Medications   amoxicillin (AMOXIL) 875 MG tablet   Acute non-recurrent frontal sinusitis       Relevant Medications   amoxicillin (AMOXIL) 875 MG tablet   fluticasone (FLONASE) 50 MCG/ACT nasal spray       Follow up plan: No Follow-up on file.

## 2016-09-12 ENCOUNTER — Other Ambulatory Visit: Payer: Self-pay | Admitting: Unknown Physician Specialty

## 2016-09-12 ENCOUNTER — Telehealth: Payer: Self-pay

## 2016-09-12 NOTE — Telephone Encounter (Signed)
Pharmacy sent a fax stating that patient requests a 90 day supply of levothyroxine be sent in instead of 30.

## 2016-09-12 NOTE — Telephone Encounter (Signed)
Your patient 

## 2016-09-13 ENCOUNTER — Telehealth: Payer: Self-pay | Admitting: Unknown Physician Specialty

## 2016-09-13 MED ORDER — CEFDINIR 300 MG PO CAPS: 300.0000 mg | ORAL_CAPSULE | Freq: Two times a day (BID) | ORAL | 0 refills | Status: DC

## 2016-09-13 MED ORDER — LEVOTHYROXINE SODIUM 100 MCG PO TABS: 100.0000 ug | ORAL_TABLET | Freq: Every day | ORAL | 3 refills | Status: DC

## 2016-09-13 NOTE — Telephone Encounter (Signed)
Patient was seen by Malachy Mood on Tues for her ears and congestion.  She was given medication but she is not getting any better.  She wants to know if Malachy Mood needs to see her again or if new medication can be called in.  Thanks

## 2016-09-13 NOTE — Telephone Encounter (Signed)
Called and left patient a VM letting her know that different antibiotic has been sent in for her.

## 2016-09-13 NOTE — Telephone Encounter (Signed)
Routing to provider  

## 2016-09-13 NOTE — Telephone Encounter (Signed)
I will change the antibiotic

## 2016-09-17 ENCOUNTER — Telehealth: Payer: Self-pay | Admitting: Unknown Physician Specialty

## 2016-09-17 NOTE — Telephone Encounter (Signed)
Patient is still having lots of problems with her ear draining, hurting, etc.  She is wanting to know how long it

## 2016-09-23 ENCOUNTER — Telehealth: Payer: Self-pay | Admitting: Unknown Physician Specialty

## 2016-09-23 DIAGNOSIS — H6060 Unspecified chronic otitis externa, unspecified ear: Secondary | ICD-10-CM

## 2016-09-23 NOTE — Telephone Encounter (Signed)
Called and let patient know that Alyssa Bryant referred her to ENT. I let patient know that she should hear something from them about and appointment and if she does not hear something soon, to give [place] a call so we can figure out what is going on.

## 2016-09-23 NOTE — Telephone Encounter (Signed)
Called and left patient a VM asking for her to please return my call.  

## 2016-09-23 NOTE — Telephone Encounter (Signed)
Patient called and left me a VM asking for me to return her call. Tried to call patient back but she did not answer. Left another VM asking for her to please return my call.

## 2016-09-23 NOTE — Telephone Encounter (Signed)
I will refer to ENT

## 2016-09-23 NOTE — Telephone Encounter (Signed)
Patient called her back. Patient had her phone on silent at work. Talked to Freight forwarder and phone is on now and by her. If you still can't reach her call her work number [phone]

## 2016-09-23 NOTE — Telephone Encounter (Signed)
Called and spoke to patient. She stated that she is still having popping sounds, roaring, and clogged ears. Patient stated that she has taken all medication that Malachy Mood gave her and she would like to know what she should do.

## 2016-10-18 DIAGNOSIS — H6060 Unspecified chronic otitis externa, unspecified ear: Secondary | ICD-10-CM | POA: Diagnosis not present

## 2016-10-18 DIAGNOSIS — H903 Sensorineural hearing loss, bilateral: Secondary | ICD-10-CM | POA: Diagnosis not present

## 2016-10-18 DIAGNOSIS — J0100 Acute maxillary sinusitis, unspecified: Secondary | ICD-10-CM | POA: Diagnosis not present

## 2016-10-18 DIAGNOSIS — H6982 Other specified disorders of Eustachian tube, left ear: Secondary | ICD-10-CM | POA: Diagnosis not present

## 2016-11-01 DIAGNOSIS — J301 Allergic rhinitis due to pollen: Secondary | ICD-10-CM | POA: Diagnosis not present

## 2016-11-01 DIAGNOSIS — H6063 Unspecified chronic otitis externa, bilateral: Secondary | ICD-10-CM | POA: Diagnosis not present

## 2016-11-01 DIAGNOSIS — J0100 Acute maxillary sinusitis, unspecified: Secondary | ICD-10-CM | POA: Diagnosis not present

## 2016-11-18 ENCOUNTER — Other Ambulatory Visit: Payer: Self-pay | Admitting: Unknown Physician Specialty

## 2017-03-04 ENCOUNTER — Ambulatory Visit: Payer: Medicare Other | Admitting: Unknown Physician Specialty

## 2017-03-25 ENCOUNTER — Ambulatory Visit: Payer: Medicare Other | Admitting: Unknown Physician Specialty

## 2017-04-05 ENCOUNTER — Other Ambulatory Visit: Payer: Self-pay | Admitting: Unknown Physician Specialty

## 2017-09-16 ENCOUNTER — Other Ambulatory Visit: Payer: Self-pay | Admitting: Unknown Physician Specialty

## 2017-09-16 NOTE — Telephone Encounter (Signed)
Please call and schedule a f/up visit per Apolonio Schneiders.  Thanks.

## 2017-09-16 NOTE — Telephone Encounter (Signed)
TSH was abnormal a year ago and never rechecked, pt overdue for recheck. Will send in one month but she needs to be seen for recheck before running out

## 2017-09-24 ENCOUNTER — Ambulatory Visit: Payer: Self-pay | Admitting: *Deleted

## 2017-09-24 NOTE — Telephone Encounter (Signed)
Pt called stating that she has been having stools yesterday; she said there was blood and clots when she wiped, and red in the commode; alsop reviously at times after she wiped there would be a little blood on the tissue; nurse triage initiated and recommendations made per protocol to include seeing a physician within 24 hours; pt normally sees Kathrine Haddock but no appointments are avalialbe within the time constraints of protocol; pt offered and accept appointment with Merrie Roof, [facility], on Thursday 09/25/17 at [place]; pt verbalizes understanding; will route to office for notification of this upcoming visit.     Reason for Disposition . MODERATE rectal bleeding (small blood clots, passing blood without stool, or toilet water turns red)  Answer Assessment - Initial Assessment Questions 1. APPEARANCE of BLOOD: "What color is it?" "Is it passed separately, on the surface of the stool, or mixed in with the stool?"      Red, mixed with stool 2. AMOUNT: "How much blood was passed?"      unsue 3. FREQUENCY: "How many times has blood been passed with the stools?"     " quite a few" for 2 days 4. ONSET: "When was the blood first seen in the stools?" (Days or weeks)     Weeks ago when she wiped blood would be on tissue 5. DIARRHEA: "Is there also some diarrhea?" If so, ask: "How many diarrhea stools were passed in past 24 hours?"     no 6. CONSTIPATION: "Do you have constipation?" If so, "How bad is it?"     no 7. RECURRENT SYMPTOMS: "Have you had blood in your stools before?" If so, ask: "When was the last time?" and "What happened that time?"      yes 8. BLOOD THINNERS: "Do you take any blood thinners?" (e.g., Coumadin/warfarin, Pradaxa/dabigatran, aspirin)     aspirin 9. OTHER SYMPTOMS: "Do you have any other symptoms?"  (e.g., abdominal pain, vomiting, dizziness, fever)     no 10. PREGNANCY: "Is there any chance you are pregnant?" "When was your last menstrual period?"        no  Protocols used: RECTAL BLEEDING-A-AH

## 2017-09-25 ENCOUNTER — Ambulatory Visit: Payer: Medicare Other | Admitting: Family Medicine

## 2017-09-25 ENCOUNTER — Encounter: Payer: Self-pay | Admitting: Family Medicine

## 2017-09-25 VITALS — BP 122/73 | HR 66 | Temp 98.4°F | Wt 197.4 lb

## 2017-09-25 DIAGNOSIS — Z1211 Encounter for screening for malignant neoplasm of colon: Secondary | ICD-10-CM

## 2017-09-25 DIAGNOSIS — K649 Unspecified hemorrhoids: Secondary | ICD-10-CM

## 2017-09-25 DIAGNOSIS — K625 Hemorrhage of anus and rectum: Secondary | ICD-10-CM | POA: Diagnosis not present

## 2017-09-25 DIAGNOSIS — G8929 Other chronic pain: Secondary | ICD-10-CM

## 2017-09-25 DIAGNOSIS — M545 Low back pain: Secondary | ICD-10-CM

## 2017-09-25 LAB — CBC WITH DIFFERENTIAL/PLATELET
HEMATOCRIT: 39.9 % (ref 34.0–46.6)
Hemoglobin: 13.8 g/dL (ref 11.1–15.9)
LYMPHS ABS: 1.1 10*3/uL (ref 0.7–3.1)
LYMPHS: 17 %
MCH: 31.7 pg (ref 26.6–33.0)
MCHC: 34.6 g/dL (ref 31.5–35.7)
MCV: 92 fL (ref 79–97)
MID (Absolute): 0.4 10*3/uL (ref 0.1–1.6)
MID: 7 %
NEUTROS ABS: 4.9 10*3/uL (ref 1.4–7.0)
NEUTROS PCT: 76 %
Platelets: 331 10*3/uL (ref 150–379)
RBC: 4.35 x10E6/uL (ref 3.77–5.28)
RDW: 13.8 % (ref 12.3–15.4)
WBC: 6.4 10*3/uL (ref 3.4–10.8)

## 2017-09-25 MED ORDER — HYDROCORTISONE 2.5 % RE CREA: 1.0000 "application " | TOPICAL_CREAM | Freq: Two times a day (BID) | RECTAL | 3 refills | Status: DC

## 2017-09-25 NOTE — Patient Instructions (Addendum)
Sitz baths, tuck's wipes, anusol cream inside and outside. Don't strain with bowel movements, strengthen pelvic floor muscles (can look up exercises for that - kiegels, etc)

## 2017-09-25 NOTE — Progress Notes (Signed)
BP 122/73 (BP Location: Right Arm, Patient Position: Sitting, Cuff Size: Normal)   Pulse 66   Temp 98.4 F (36.9 C) (Oral)   Wt 197 lb 6.4 oz (89.5 kg)   LMP  (LMP Unknown)   SpO2 98%   BMI 35.53 kg/m    Subjective:    Patient ID: Alyssa Bryant, female    DOB: Nov 04, 1950, 67 y.o.   MRN: [id number]  HPI: Alyssa Bryant is a 67 y.o. female  Chief Complaint  Patient presents with  . Rectal Bleeding    Patient states she always has blood on toilet tissue after bowel movement. Hx of internal hemorrhoids. Tuesday night patient stated there were blood clots and the toilet was full of blood.    . Back Pain    Right sided, ongoing for about 2 months. Hurts in the morning but as day goes on gets better.   Bright red blood and some clots with wiping as well as blood in toilet bowl the past few days. Typically will have some blood after wiping with BMs but this was more than usual. Has had this issue once before and dx'ed in ER with hemorrhoids. No rectal pain, Not trying anything OTC. Always has formed stools, no major constipation, abdominal pain, fevers noted. Last colonoscopy was about 15 years ago with no significant findings.   Right sided back pain in the mornings, mellows out as the day goes on. Takes 3 aleve in the morning which helps. Denies hx of back injury, radiation down legs, weakness, numbness, tingling, incontinence issues.   Past Medical History:  Diagnosis Date  . Hypertension   . Thyroid disease    Social History   Socioeconomic History  . Marital status: Married    Spouse name: Not on file  . Number of children: Not on file  . Years of education: Not on file  . Highest education level: Not on file  Occupational History  . Not on file  Social Needs  . Financial resource strain: Not on file  . Food insecurity:    Worry: Not on file    Inability: Not on file  . Transportation needs:    Medical: Not on file    Non-medical: Not on file  Tobacco Use  . Smoking  status: Never Smoker  . Smokeless tobacco: Never Used  Substance and Sexual Activity  . Alcohol use: No    Alcohol/week: 0.0 oz  . Drug use: No  . Sexual activity: Yes  Lifestyle  . Physical activity:    Days per week: Not on file    Minutes per session: Not on file  . Stress: Not on file  Relationships  . Social connections:    Talks on phone: Not on file    Gets together: Not on file    Attends religious service: Not on file    Active member of club or organization: Not on file    Attends meetings of clubs or organizations: Not on file    Relationship status: Not on file  . Intimate partner violence:    Fear of current or ex partner: Not on file    Emotionally abused: Not on file    Physically abused: Not on file    Forced sexual activity: Not on file  Other Topics Concern  . Not on file  Social History Narrative  . Not on file    Relevant past medical, surgical, family and social history reviewed and updated as indicated. Interim medical history since  our last visit reviewed. Allergies and medications reviewed and updated.  Review of Systems  Per HPI unless specifically indicated above     Objective:    BP 122/73 (BP Location: Right Arm, Patient Position: Sitting, Cuff Size: Normal)   Pulse 66   Temp 98.4 F (36.9 C) (Oral)   Wt 197 lb 6.4 oz (89.5 kg)   LMP  (LMP Unknown)   SpO2 98%   BMI 35.53 kg/m   Wt Readings from Last 3 Encounters:  09/25/17 197 lb 6.4 oz (89.5 kg)  09/10/16 189 lb 6.4 oz (85.9 kg)  09/03/16 186 lb 6.4 oz (84.6 kg)    Physical Exam  Constitutional: She is oriented to person, place, and time. She appears well-developed and well-nourished. No distress.  HENT:  Head: Atraumatic.  Eyes: Pupils are equal, round, and reactive to light. Conjunctivae are normal.  Neck: Normal range of motion. Neck supple.  Cardiovascular: Normal rate and normal heart sounds.  Pulmonary/Chest: Effort normal and breath sounds normal. She has no wheezes.    Abdominal: Soft. Bowel sounds are normal. There is no tenderness.  Genitourinary:     Genitourinary Comments: Multiple palpable inflamed internal hemorrhoids, mildly ttp  Musculoskeletal: Normal range of motion.  Neurological: She is alert and oriented to person, place, and time.  Skin: Skin is warm and dry.  Psychiatric: She has a normal mood and affect. Her behavior is normal.  Nursing note and vitals reviewed.   Results for orders placed or performed in visit on 09/25/17  CBC With Differential/Platelet  Result Value Ref Range   WBC 6.4 3.4 - 10.8 x10E3/uL   RBC 4.35 3.77 - 5.28 x10E6/uL   Hemoglobin 13.8 11.1 - 15.9 g/dL   Hematocrit 39.9 34.0 - 46.6 %   MCV 92 79 - 97 fL   MCH 31.7 26.6 - 33.0 pg   MCHC 34.6 31.5 - 35.7 g/dL   RDW 13.8 12.3 - 15.4 %   Platelets 331 150 - 379 x10E3/uL   Neutrophils 76 Not Estab. %   Lymphs 17 Not Estab. %   MID 7 Not Estab. %   Neutrophils Absolute 4.9 1.4 - 7.0 x10E3/uL   Lymphocytes Absolute 1.1 0.7 - 3.1 x10E3/uL   MID (Absolute) 0.4 0.1 - 1.6 X10E3/uL      Assessment & Plan:   Problem List Items Addressed This Visit    None    Visit Diagnoses    Hemorrhoids, unspecified hemorrhoid type    -  Primary   CBC WNL, exam and hx consistent with irritated internal and external hemorrhoids. Will tx with anusol, sitz baths, tucks wipes and monitor for improvement    Relevant Orders   CBC With Differential/Platelet (Completed)   Screening for colon cancer       Pt overdue, agreeable to getting repeat screening done. Ordered today   Relevant Orders   Ambulatory referral to Gastroenterology   Chronic right-sided low back pain without sciatica       Suspect arthritic, discussed supportive care w/ heat, stretches, low impact exercise, tylenol/ NSAIDs alternating, lidocaine patches.Will get x-ray if worsening       Follow up plan: Return for as scheduled.

## 2017-10-02 ENCOUNTER — Other Ambulatory Visit: Payer: Self-pay | Admitting: Unknown Physician Specialty

## 2017-10-02 NOTE — Telephone Encounter (Signed)
Lisinopril/HCTZ refill Last OV: 09/03/16 Last Refill:04/07/17 Pharmacy:[facility] [address] PCP: Kathrine Haddock NP

## 2017-10-08 ENCOUNTER — Encounter: Payer: Self-pay | Admitting: *Deleted

## 2017-10-13 ENCOUNTER — Other Ambulatory Visit: Payer: Self-pay | Admitting: Family Medicine

## 2017-10-14 NOTE — Telephone Encounter (Signed)
Pt needs seen and TSH for further refills

## 2017-10-14 NOTE — Telephone Encounter (Signed)
Please call patient and schedule a visit per [facility] for f/up and further medication refills.

## 2017-10-14 NOTE — Telephone Encounter (Signed)
Last TSH 09/03/16 Kathrine Haddock

## 2017-10-23 ENCOUNTER — Other Ambulatory Visit: Payer: Self-pay

## 2017-10-23 ENCOUNTER — Telehealth: Payer: Self-pay

## 2017-10-23 DIAGNOSIS — Z1211 Encounter for screening for malignant neoplasm of colon: Secondary | ICD-10-CM

## 2017-10-23 NOTE — Telephone Encounter (Signed)
Gastroenterology Pre-Procedure Review  Request Date: 11/03/17 Requesting Physician: Dr. Allen Norris  PATIENT REVIEW QUESTIONS: The patient responded to the following health history questions as indicated:    1. Are you having any GI issues? yes (hemorrhoids) 2. Do you have a personal history of Polyps? yes 3. Do you have a family history of Colon Cancer or Polyps? no 4. Diabetes Mellitus? no 5. Joint replacements in the past 12 months?no 6. Major health problems in the past 3 months?no 7. Any artificial heart valves, MVP, or defibrillator?no    MEDICATIONS & ALLERGIES:    Patient reports the following regarding taking any anticoagulation/antiplatelet therapy:   Plavix, Coumadin, Eliquis, Xarelto, Lovenox, Pradaxa, Brilinta, or Effient? no Aspirin? no  Patient confirms/reports the following medications:  Current Outpatient Medications  Medication Sig Dispense Refill  . aspirin 81 MG tablet Take 81 mg by mouth daily.    . hydrocortisone (ANUSOL-HC) 2.5 % rectal cream Place 1 application rectally 2 (two) times daily. 30 g 3  . levothyroxine (SYNTHROID, LEVOTHROID) 100 MCG tablet TAKE 1 TABLET BY MOUTH ONCE DAILY BEFORE BREAKFAST 30 tablet 0  . lisinopril-hydrochlorothiazide (PRINZIDE,ZESTORETIC) 20-12.5 MG tablet TAKE 2 TABLETS BY MOUTH ONCE DAILY 60 tablet 0  . naproxen (NAPROSYN) 500 MG tablet Take 1 tablet (500 mg total) by mouth 2 (two) times daily with a meal. 20 tablet 0   No current facility-administered medications for this visit.     Patient confirms/reports the following allergies:  Allergies  Allergen Reactions  . Sulfa Antibiotics     No orders of the defined types were placed in this encounter.   AUTHORIZATION INFORMATION Primary Insurance: 1D#: Group #:  Secondary Insurance: 1D#: Group #:  SCHEDULE INFORMATION: Date: 11/03/17 Time: Location:[place]

## 2017-10-30 ENCOUNTER — Other Ambulatory Visit: Payer: Self-pay

## 2017-10-30 ENCOUNTER — Telehealth: Payer: Self-pay | Admitting: Gastroenterology

## 2017-10-30 MED ORDER — NA SULFATE-K SULFATE-MG SULF 17.5-3.13-1.6 GM/177ML PO SOLN: 1.0000 | Freq: Once | ORAL | 0 refills | Status: AC

## 2017-10-30 NOTE — Telephone Encounter (Signed)
LVM for pt to contact office so I can send colonoscopy instructions to her pharmacy or email.  Thanks [employer]

## 2017-10-30 NOTE — Telephone Encounter (Signed)
Patient called and has not received her prepping information, please call patient.

## 2017-10-31 NOTE — Discharge Instructions (Signed)
General Anesthesia, Adult, Care After °These instructions provide you with information about caring for yourself after your procedure. Your health care provider may also give you more specific instructions. Your treatment has been planned according to current medical practices, but problems sometimes occur. Call your health care provider if you have any problems or questions after your procedure. °What can I expect after the procedure? °After the procedure, it is common to have: °· Vomiting. °· A sore throat. °· Mental slowness. ° °It is common to feel: °· Nauseous. °· Cold or shivery. °· Sleepy. °· Tired. °· Sore or achy, even in parts of your body where you did not have surgery. ° °Follow these instructions at home: °For at least 24 hours after the procedure: °· Do not: °? Participate in activities where you could fall or become injured. °? Drive. °? Use heavy machinery. °? Drink alcohol. °? Take sleeping pills or medicines that cause drowsiness. °? Make important decisions or sign legal documents. °? Take care of children on your own. °· Rest. °Eating and drinking °· If you vomit, drink water, juice, or soup when you can drink without vomiting. °· Drink enough fluid to keep your urine clear or pale yellow. °· Make sure you have little or no nausea before eating solid foods. °· Follow the diet recommended by your health care provider. °General instructions °· Have a responsible adult stay with you until you are awake and alert. °· Return to your normal activities as told by your health care provider. Ask your health care provider what activities are safe for you. °· Take over-the-counter and prescription medicines only as told by your health care provider. °· If you smoke, do not smoke without supervision. °· Keep all follow-up visits as told by your health care provider. This is important. °Contact a health care provider if: °· You continue to have nausea or vomiting at home, and medicines are not helpful. °· You  cannot drink fluids or start eating again. °· You cannot urinate after 8-12 hours. °· You develop a skin rash. °· You have fever. °· You have increasing redness at the site of your procedure. °Get help right away if: °· You have difficulty breathing. °· You have chest pain. °· You have unexpected bleeding. °· You feel that you are having a life-threatening or urgent problem. °This information is not intended to replace advice given to you by your health care provider. Make sure you discuss any questions you have with your health care provider. °Document Released: 09/30/2000 Document Revised: 11/27/2015 Document Reviewed: 06/08/2015 °Elsevier Interactive Patient Education © 2018 [employer] ° °

## 2017-11-03 ENCOUNTER — Ambulatory Visit
Admission: RE | Admit: 2017-11-03 | Discharge: 2017-11-03 | Disposition: A | Discharge status: Home / Self Care | Payer: Medicare Other | Source: Ambulatory Visit | Attending: Gastroenterology | Admitting: Gastroenterology

## 2017-11-03 ENCOUNTER — Ambulatory Visit: Payer: Medicare Other | Admitting: Anesthesiology

## 2017-11-03 ENCOUNTER — Encounter
Admission: RE | Disposition: A | Discharge status: Home / Self Care | Payer: Self-pay | Source: Ambulatory Visit | Attending: Gastroenterology

## 2017-11-03 DIAGNOSIS — Z7982 Long term (current) use of aspirin: Secondary | ICD-10-CM | POA: Diagnosis not present

## 2017-11-03 DIAGNOSIS — I10 Essential (primary) hypertension: Secondary | ICD-10-CM | POA: Diagnosis not present

## 2017-11-03 DIAGNOSIS — K640 First degree hemorrhoids: Secondary | ICD-10-CM | POA: Diagnosis not present

## 2017-11-03 DIAGNOSIS — E039 Hypothyroidism, unspecified: Secondary | ICD-10-CM | POA: Insufficient documentation

## 2017-11-03 DIAGNOSIS — K5730 Diverticulosis of large intestine without perforation or abscess without bleeding: Secondary | ICD-10-CM | POA: Diagnosis not present

## 2017-11-03 DIAGNOSIS — Z79899 Other long term (current) drug therapy: Secondary | ICD-10-CM | POA: Diagnosis not present

## 2017-11-03 DIAGNOSIS — Z1211 Encounter for screening for malignant neoplasm of colon: Secondary | ICD-10-CM | POA: Insufficient documentation

## 2017-11-03 HISTORY — DX: Dyspnea, unspecified: R06.00

## 2017-11-03 HISTORY — PX: COLONOSCOPY WITH PROPOFOL: SHX5780

## 2017-11-03 HISTORY — DX: Hypothyroidism, unspecified: E03.9

## 2017-11-03 HISTORY — DX: Unspecified osteoarthritis, unspecified site: M19.90

## 2017-11-03 SURGERY — COLONOSCOPY WITH PROPOFOL
Anesthesia: General | Wound class: Clean Contaminated

## 2017-11-03 MED ORDER — LIDOCAINE HCL (CARDIAC) PF 100 MG/5ML IV SOSY
PREFILLED_SYRINGE | INTRAVENOUS | Status: DC | PRN
  Administered 2017-11-03: 20 mg via INTRAVENOUS

## 2017-11-03 MED ORDER — PROPOFOL 10 MG/ML IV BOLUS
INTRAVENOUS | Status: DC | PRN
  Administered 2017-11-03 (×3): 40 mg via INTRAVENOUS
  Administered 2017-11-03: 20 mg via INTRAVENOUS
  Administered 2017-11-03: 70 mg via INTRAVENOUS
  Administered 2017-11-03: 30 mg via INTRAVENOUS
  Administered 2017-11-03: 20 mg via INTRAVENOUS

## 2017-11-03 MED ORDER — LACTATED RINGERS IV SOLN
10.0000 mL/h | INTRAVENOUS | Status: DC
  Administered 2017-11-03: 10:00:00 via INTRAVENOUS
  Administered 2017-11-03: 10 mL/h via INTRAVENOUS

## 2017-11-03 MED ORDER — STERILE WATER FOR IRRIGATION IR SOLN
Status: DC | PRN
  Administered 2017-11-03: 10:00:00

## 2017-11-03 SURGICAL SUPPLY — 24 items

## 2017-11-03 NOTE — Anesthesia Postprocedure Evaluation (Signed)
Anesthesia Post Note  Patient: Alyssa Bryant  Procedure(s) Performed: COLONOSCOPY WITH PROPOFOL (N/A )  Patient location during evaluation: PACU Anesthesia Type: General Level of consciousness: awake and alert Pain management: pain level controlled Vital Signs Assessment: post-procedure vital signs reviewed and stable Respiratory status: spontaneous breathing, nonlabored ventilation, respiratory function stable and patient connected to nasal cannula oxygen Cardiovascular status: blood pressure returned to baseline and stable Postop Assessment: no apparent nausea or vomiting Anesthetic complications: no    Alisa Graff

## 2017-11-03 NOTE — Anesthesia Procedure Notes (Signed)
Procedure Name: MAC Date/Time: 11/03/2017 10:19 AM Performed by: Lind Guest, CRNA Pre-anesthesia Checklist: Patient identified, Suction available, Emergency Drugs available, Patient being monitored and Timeout performed Patient Re-evaluated:Patient Re-evaluated prior to induction Oxygen Delivery Method: Nasal cannula

## 2017-11-03 NOTE — Op Note (Signed)
[facility] Patient Name: Alyssa Bryant Procedure Date: 11/03/2017 10:11 AM MRN: [id number] Account #: [id number] Date of Birth: 1951-01-11 Admit Type: Outpatient Age: 67 Room: [facility] OR ROOM 01 Gender: Female Note Status: Finalized Procedure:            Colonoscopy Indications:          Screening for colorectal malignant neoplasm Providers:            [name] MD, MD Referring MD:         Kathrine Haddock (Referring MD) Medicines:            Monitored Anesthesia Care Complications:        No immediate complications. Procedure:            Pre-Anesthesia Assessment:                       - Prior to the procedure, a History and Physical was                        performed, and patient medications and allergies were                        reviewed. The patient's tolerance of previous                        anesthesia was also reviewed. The risks and benefits of                        the procedure and the sedation options and risks were                        discussed with the patient. All questions were                        answered, and informed consent was obtained. Prior                        Anticoagulants: The patient has taken no previous                        anticoagulant or antiplatelet agents. ASA Grade                        Assessment: II - A patient with mild systemic disease.                        After reviewing the risks and benefits, the patient was                        deemed in satisfactory condition to undergo the                        procedure.                       After obtaining informed consent, the colonoscope was                        passed under direct vision. Throughout the procedure,  the patient's blood pressure, pulse, and oxygen                        saturations were monitored continuously. The Olympus                        Colonoscope 190 [id number]) was introduced through the                    anus and advanced to the the cecum, identified by                        appendiceal orifice and ileocecal valve. The                        colonoscopy was performed without difficulty. The                        patient tolerated the procedure well. The quality of                        the bowel preparation was excellent. Findings:      The perianal and digital rectal examinations were normal.      Multiple small-mouthed diverticula were found in the sigmoid colon.      Non-bleeding internal hemorrhoids were found during retroflexion. The       hemorrhoids were Grade I (internal hemorrhoids that do not prolapse). Impression:           - Diverticulosis in the sigmoid colon.                       - Non-bleeding internal hemorrhoids.                       - No specimens collected. Recommendation:       - Discharge patient to home.                       - Resume previous diet.                       - Continue present medications.                       - Await pathology results.                       - Repeat colonoscopy in 10 years for screening unless                        any change in family history or lower GI problems. Procedure Code(s):    --- Professional ---                       [phone], Colonoscopy, flexible; diagnostic, including                        collection of specimen(s) by brushing or washing, when                        performed (separate procedure) Diagnosis Code(s):    --- Professional ---  Z12.11, Encounter for screening for malignant neoplasm                        of colon CPT copyright 2017 American Medical Association. All rights reserved. The codes documented in this report are preliminary and upon coder review may  be revised to meet current compliance requirements. [name] MD, MD 11/03/2017 10:41:08 AM This report has been signed electronically. Number of Addenda: 0 Note Initiated On: 11/03/2017 10:11 AM Scope  Withdrawal Time: 0 hours 7 minutes 37 seconds  Total Procedure Duration: 0 hours 17 minutes 53 seconds       [facility]

## 2017-11-03 NOTE — Transfer of Care (Signed)
Immediate Anesthesia Transfer of Care Note  Patient: Alyssa Bryant  Procedure(s) Performed: COLONOSCOPY WITH PROPOFOL (N/A )  Patient Location: PACU  Anesthesia Type: General  Level of Consciousness: awake, alert  and patient cooperative  Airway and Oxygen Therapy: Patient Spontanous Breathing and Patient connected to supplemental oxygen  Post-op Assessment: Post-op Vital signs reviewed, Patient's Cardiovascular Status Stable, Respiratory Function Stable, Patent Airway and No signs of Nausea or vomiting  Post-op Vital Signs: Reviewed and stable  Complications: No apparent anesthesia complications

## 2017-11-03 NOTE — Anesthesia Preprocedure Evaluation (Signed)
Anesthesia Evaluation  Patient identified by MRN, date of birth, ID band Patient awake    Reviewed: Allergy & Precautions, H&P , NPO status , Patient's Chart, lab work & pertinent test results, reviewed documented beta blocker date and time   Airway Mallampati: II  TM Distance: >3 FB Neck ROM: full    Dental no notable dental hx.    Pulmonary neg pulmonary ROS,    Pulmonary exam normal breath sounds clear to auscultation       Cardiovascular Exercise Tolerance: Good hypertension,  Rhythm:regular Rate:Normal     Neuro/Psych negative neurological ROS  negative psych ROS   GI/Hepatic negative GI ROS, Neg liver ROS,   Endo/Other  Hypothyroidism   Renal/GU negative Renal ROS  negative genitourinary   Musculoskeletal   Abdominal   Peds  Hematology negative hematology ROS (+)   Anesthesia Other Findings   Reproductive/Obstetrics negative OB ROS                             Anesthesia Physical Anesthesia Plan  ASA: II  Anesthesia Plan: General   Post-op Pain Management:    Induction:   PONV Risk Score and Plan:   Airway Management Planned:   Additional Equipment:   Intra-op Plan:   Post-operative Plan:   Informed Consent: I have reviewed the patients History and Physical, chart, labs and discussed the procedure including the risks, benefits and alternatives for the proposed anesthesia with the patient or authorized representative who has indicated his/her understanding and acceptance.   Dental Advisory Given  Plan Discussed with: CRNA  Anesthesia Plan Comments:         Anesthesia Quick Evaluation

## 2017-11-03 NOTE — H&P (Signed)
Alyssa Lame, MD [facility] [address], [place], [place] [address] Phone: [phone] Fax : [phone]  Primary Care Physician:  Kathrine Haddock, NP Primary Gastroenterologist:  Dr. Allen Norris  Pre-Procedure History & Physical: HPI:  Alyssa Bryant is a 67 y.o. female is here for a screening colonoscopy.   Past Medical History:  Diagnosis Date  . Arthritis    back  . Dyspnea   . Hypertension   . Hypothyroidism   . Thyroid disease     Past Surgical History:  Procedure Laterality Date  . ABDOMINAL HYSTERECTOMY  1978   partial    Prior to Admission medications   Medication Sig Start Date End Date Taking? Authorizing Provider  hydrocortisone (ANUSOL-HC) 2.5 % rectal cream Place 1 application rectally 2 (two) times daily. 09/25/17  Yes [name], PA-C  levothyroxine (SYNTHROID, LEVOTHROID) 100 MCG tablet TAKE 1 TABLET BY MOUTH ONCE DAILY BEFORE BREAKFAST 10/14/17  Yes Kathrine Haddock, NP  lisinopril-hydrochlorothiazide (PRINZIDE,ZESTORETIC) 20-12.5 MG tablet TAKE 2 TABLETS BY MOUTH ONCE DAILY 10/02/17  Yes Kathrine Haddock, NP  aspirin 81 MG tablet Take 81 mg by mouth daily.    [provider]  naproxen (NAPROSYN) 500 MG tablet Take 1 tablet (500 mg total) by mouth 2 (two) times daily with a meal. 07/10/16   Kathrine Haddock, NP    Allergies as of 10/23/2017 - Review Complete 09/25/2017  Allergen Reaction Noted  . Sulfa antibiotics  09/05/2015    Family History  Problem Relation Age of Onset  . Hyperlipidemia Mother   . Hyperthyroidism Mother   . Cancer Father        lymphoma  . Hypertension Father   . Stroke Sister   . Hypertension Son   . Thyroid disease Son   . Hypertension Sister     Social History   Socioeconomic History  . Marital status: Married    Spouse name: Not on file  . Number of children: Not on file  . Years of education: Not on file  . Highest education level: Not on file  Occupational History  . Not on file  Social Needs  .  Financial resource strain: Not on file  . Food insecurity:    Worry: Not on file    Inability: Not on file  . Transportation needs:    Medical: Not on file    Non-medical: Not on file  Tobacco Use  . Smoking status: Never Smoker  . Smokeless tobacco: Never Used  Substance and Sexual Activity  . Alcohol use: No    Alcohol/week: 0.0 oz  . Drug use: No  . Sexual activity: Yes  Lifestyle  . Physical activity:    Days per week: Not on file    Minutes per session: Not on file  . Stress: Not on file  Relationships  . Social connections:    Talks on phone: Not on file    Gets together: Not on file    Attends religious service: Not on file    Active member of club or organization: Not on file    Attends meetings of clubs or organizations: Not on file    Relationship status: Not on file  . Intimate partner violence:    Fear of current or ex partner: Not on file    Emotionally abused: Not on file    Physically abused: Not on file    Forced sexual activity: Not on file  Other Topics Concern  . Not on file  Social History Narrative  .  Not on file    Review of Systems: See HPI, otherwise negative ROS  Physical Exam: BP 127/66   Pulse 83   Temp 97.9 F (36.6 C) (Temporal)   Resp 17   Ht 5\' 2"  (1.575 m)   Wt 189 lb (85.7 kg)   LMP  (LMP Unknown)   SpO2 95%   BMI 34.57 kg/m  General:   Alert,  pleasant and cooperative in NAD Head:  Normocephalic and atraumatic. Neck:  Supple; no masses or thyromegaly. Lungs:  Clear throughout to auscultation.    Heart:  Regular rate and rhythm. Abdomen:  Soft, nontender and nondistended. Normal bowel sounds, without guarding, and without rebound.   Neurologic:  Alert and  oriented x4;  grossly normal neurologically.  Impression/Plan: Alyssa Bryant is now here to undergo a screening colonoscopy.  Risks, benefits, and alternatives regarding colonoscopy have been reviewed with the patient.  Questions have been answered.  All parties  agreeable.

## 2017-11-04 ENCOUNTER — Encounter: Payer: Self-pay | Admitting: Gastroenterology

## 2017-11-04 ENCOUNTER — Other Ambulatory Visit: Payer: Self-pay | Admitting: Unknown Physician Specialty

## 2017-11-04 NOTE — Telephone Encounter (Signed)
Pt needs seen for further refills

## 2017-11-04 NOTE — Telephone Encounter (Signed)
Patient has been given a 30 day supply with note she needs appointment. Do you want to fill again?

## 2017-11-14 ENCOUNTER — Telehealth: Payer: Self-pay | Admitting: Unknown Physician Specialty

## 2017-11-14 NOTE — Telephone Encounter (Signed)
Copied from [place] [phone]. Topic: Medicare AWV >> Nov 14, 2017  9:03 AM Leo Rod wrote: Called to schedule Medicare Annual Wellness Visit with Nurse Health Advisor. If patient returns call please note: their last AWV was on 09/03/16 please schedule AWV with NHA any date   Thank you! For any questions please contact: Jill Alexanders [phone]  Skype [email or web address]

## 2017-11-15 ENCOUNTER — Other Ambulatory Visit: Payer: Self-pay | Admitting: Unknown Physician Specialty

## 2017-11-17 ENCOUNTER — Telehealth: Payer: Self-pay | Admitting: Unknown Physician Specialty

## 2017-11-17 NOTE — Telephone Encounter (Signed)
Copied from [place] [phone]. Topic: Quick Communication - Rx Refill/Question >> Nov 17, 2017  3:47 PM Oliver Pila B wrote: Medication: levothyroxine (SYNTHROID, LEVOTHROID) 100 MCG tablet [[id number]]  Has the patient contacted their pharmacy? Yes.   (Agent: If no, request that the patient contact the pharmacy for the refill.) Preferred Pharmacy (with phone number or street name): [facility] Agent: Please be advised that RX refills may take up to 3 business days. We ask that you follow-up with your pharmacy.

## 2017-11-18 ENCOUNTER — Encounter: Payer: Self-pay | Admitting: Unknown Physician Specialty

## 2017-11-18 ENCOUNTER — Ambulatory Visit (INDEPENDENT_AMBULATORY_CARE_PROVIDER_SITE_OTHER): Payer: Medicare Other | Admitting: Unknown Physician Specialty

## 2017-11-18 VITALS — BP 139/83 | HR 79 | Ht 64.0 in | Wt 194.0 lb

## 2017-11-18 DIAGNOSIS — I10 Essential (primary) hypertension: Secondary | ICD-10-CM | POA: Diagnosis not present

## 2017-11-18 DIAGNOSIS — E038 Other specified hypothyroidism: Secondary | ICD-10-CM

## 2017-11-18 DIAGNOSIS — Z23 Encounter for immunization: Secondary | ICD-10-CM

## 2017-11-18 DIAGNOSIS — E782 Mixed hyperlipidemia: Secondary | ICD-10-CM | POA: Diagnosis not present

## 2017-11-18 DIAGNOSIS — Z1159 Encounter for screening for other viral diseases: Secondary | ICD-10-CM | POA: Diagnosis not present

## 2017-11-18 MED ORDER — LISINOPRIL-HYDROCHLOROTHIAZIDE 20-12.5 MG PO TABS: 2.0000 | ORAL_TABLET | Freq: Every day | ORAL | 1 refills | Status: DC

## 2017-11-18 MED ORDER — LEVOTHYROXINE SODIUM 100 MCG PO TABS: ORAL_TABLET | ORAL | 0 refills | Status: DC

## 2017-11-18 NOTE — Assessment & Plan Note (Signed)
Check TSH and thyroid panel today

## 2017-11-18 NOTE — Telephone Encounter (Signed)
Pt requesting refill for synthroid 100 mcg tab. Has appointment today with Kathrine Haddock.  Please review.

## 2017-11-18 NOTE — Assessment & Plan Note (Signed)
Stable, continue present medications.   

## 2017-11-18 NOTE — Telephone Encounter (Signed)
RX sent in during patient's appointment.

## 2017-11-18 NOTE — Progress Notes (Signed)
BP 139/83   Pulse 79   Ht 5\' 4"  (1.626 m)   Wt 194 lb (88 kg)   LMP  (LMP Unknown)   SpO2 96%   BMI 33.30 kg/m    Subjective:    Patient ID: Alyssa Bryant, female    DOB: 06-Sep-1950, 67 y.o.   MRN: [id number]  HPI: Alyssa Bryant is a 67 y.o. female  Chief Complaint  Patient presents with  . Medication Refill    Thyroid   Hypertension Using medications without difficulty Average home BPs doesn't check   No problems or lightheadedness No chest pain with exertion or shortness of breath No Edema  The 10-year ASCVD risk score Mikey Bussing DC Jr., et al., 2013) is: 15%   Values used to calculate the score:     Age: 67 years     Sex: Female     Is Non-Hispanic African American: No     Diabetic: No     Tobacco smoker: No     Systolic Blood Pressure: 166 mmHg     Is BP treated: Yes     HDL Cholesterol: 38 mg/dL     Total Cholesterol: 227 mg/dL   Hypothyroid No change in weight or energy.  Currently on 100 mcgs of Levothyroxine   Relevant past medical, surgical, family and social history reviewed and updated as indicated. Interim medical history since our last visit reviewed. Allergies and medications reviewed and updated.  Review of Systems  Constitutional: Negative.   HENT: Negative.   Respiratory: Negative.   Gastrointestinal: Negative.     Per HPI unless specifically indicated above     Objective:    BP 139/83   Pulse 79   Ht 5\' 4"  (1.626 m)   Wt 194 lb (88 kg)   LMP  (LMP Unknown)   SpO2 96%   BMI 33.30 kg/m   Wt Readings from Last 3 Encounters:  11/18/17 194 lb (88 kg)  11/03/17 189 lb (85.7 kg)  09/25/17 197 lb 6.4 oz (89.5 kg)    Physical Exam  Constitutional: She is oriented to person, place, and time. She appears well-developed and well-nourished. No distress.  HENT:  Head: Normocephalic and atraumatic.  Eyes: Conjunctivae and lids are normal. Right eye exhibits no discharge. Left eye exhibits no discharge. No scleral icterus.  Neck: Normal range  of motion. Neck supple. No JVD present. Carotid bruit is not present.  Cardiovascular: Normal rate, regular rhythm and normal heart sounds.  Pulmonary/Chest: Effort normal and breath sounds normal.  Abdominal: Normal appearance. There is no splenomegaly or hepatomegaly.  Musculoskeletal: Normal range of motion.  Neurological: She is alert and oriented to person, place, and time.  Skin: Skin is warm, dry and intact. No rash noted. No pallor.  Psychiatric: She has a normal mood and affect. Her behavior is normal. Judgment and thought content normal.    Results for orders placed or performed in visit on 09/25/17  CBC With Differential/Platelet  Result Value Ref Range   WBC 6.4 3.4 - 10.8 x10E3/uL   RBC 4.35 3.77 - 5.28 x10E6/uL   Hemoglobin 13.8 11.1 - 15.9 g/dL   Hematocrit 39.9 34.0 - 46.6 %   MCV 92 79 - 97 fL   MCH 31.7 26.6 - 33.0 pg   MCHC 34.6 31.5 - 35.7 g/dL   RDW 13.8 12.3 - 15.4 %   Platelets 331 150 - 379 x10E3/uL   Neutrophils 76 Not Estab. %   Lymphs 17 Not Estab. %  MID 7 Not Estab. %   Neutrophils Absolute 4.9 1.4 - 7.0 x10E3/uL   Lymphocytes Absolute 1.1 0.7 - 3.1 x10E3/uL   MID (Absolute) 0.4 0.1 - 1.6 X10E3/uL      Assessment & Plan:   Problem List Items Addressed This Visit      Unprioritized   Hypertension    Stable, continue present medications.        Relevant Medications   lisinopril-hydrochlorothiazide (PRINZIDE,ZESTORETIC) 20-12.5 MG tablet   Other Relevant Orders   Comprehensive metabolic panel   Hypothyroidism    Check TSH and thyroid panel today      Relevant Medications   levothyroxine (SYNTHROID, LEVOTHROID) 100 MCG tablet   Other Relevant Orders   Thyroid Panel With TSH   Mixed hyperlipidemia    Reviewed ASCVD risk.  She is willing to start on a statin if cholesterol remains the same.        Relevant Medications   lisinopril-hydrochlorothiazide (PRINZIDE,ZESTORETIC) 20-12.5 MG tablet   Other Relevant Orders   Lipid Panel w/o  Chol/HDL Ratio   Need for hepatitis C screening test   Relevant Orders   Hepatitis C antibody    Other Visit Diagnoses    Need for pneumococcal vaccination    -  Primary   Relevant Orders   Pneumococcal polysaccharide vaccine 23-valent greater than or equal to 2yo subcutaneous/IM (Completed)       Follow up plan: Return in about 6 months (around 05/21/2018).

## 2017-11-18 NOTE — Patient Instructions (Addendum)
Please do call to schedule your mammogram; the number to schedule one at either [facility] or [facility] is [phone]  Pneumococcal Polysaccharide Vaccine: What You Need to Know 1. Why get vaccinated? Vaccination can protect older adults (and some children and younger adults) from pneumococcal disease. Pneumococcal disease is caused by bacteria that can spread from person to person through close contact. It can cause ear infections, and it can also lead to more serious infections of the:  Lungs (pneumonia),  Blood (bacteremia), and  Covering of the brain and spinal cord (meningitis). Meningitis can cause deafness and brain damage, and it can be fatal.  Anyone can get pneumococcal disease, but children under 30 years of age, people with certain medical conditions, adults over 31 years of age, and cigarette smokers are at the highest risk. About 18,000 older adults die each year from pneumococcal disease in the [place]. Treatment of pneumococcal infections with penicillin and other drugs used to be more effective. But some strains of the disease have become resistant to these drugs. This makes prevention of the disease, through vaccination, even more important. 2. Pneumococcal polysaccharide vaccine (PPSV23) Pneumococcal polysaccharide vaccine (PPSV23) protects against 23 types of pneumococcal bacteria. It will not prevent all pneumococcal disease. PPSV23 is recommended for:  All adults 52 years of age and older,  Anyone 2 through 69 years of age with certain long-term health problems,  Anyone 2 through 70 years of age with a weakened immune system,  Adults 21 through 69 years of age who smoke cigarettes or have asthma.  Most people need only one dose of PPSV. A second dose is recommended for certain high-risk groups. People 11 and older should get a dose even if they have gotten one or more doses of the vaccine before they turned 65. Your healthcare  provider can give you more information about these recommendations. Most healthy adults develop protection within 2 to 3 weeks of getting the shot. 3. Some people should not get this vaccine  Anyone who has had a life-threatening allergic reaction to PPSV should not get another dose.  Anyone who has a severe allergy to any component of PPSV should not receive it. Tell your provider if you have any severe allergies.  Anyone who is moderately or severely ill when the shot is scheduled may be asked to wait until they recover before getting the vaccine. Someone with a mild illness can usually be vaccinated.  Children less than 24 years of age should not receive this vaccine.  There is no evidence that PPSV is harmful to either a pregnant woman or to her fetus. However, as a precaution, women who need the vaccine should be vaccinated before becoming pregnant, if possible. 4. Risks of a vaccine reaction With any medicine, including vaccines, there is a chance of side effects. These are usually mild and go away on their own, but serious reactions are also possible. About half of people who get PPSV have mild side effects, such as redness or pain where the shot is given, which go away within about two days. Less than 1 out of 100 people develop a fever, muscle aches, or more severe local reactions. Problems that could happen after any vaccine:  People sometimes faint after a medical procedure, including vaccination. Sitting or lying down for about 15 minutes can help prevent fainting, and injuries caused by a fall. Tell your doctor if you feel dizzy, or have vision changes or ringing in the ears.  Some people get severe pain in the shoulder and have difficulty moving the arm where a shot was given. This happens very rarely.  Any medication can cause a severe allergic reaction. Such reactions from a vaccine are very rare, estimated at about 1 in a million doses, and would happen within a few minutes to  a few hours after the vaccination. As with any medicine, there is a very remote chance of a vaccine causing a serious injury or death. The safety of vaccines is always being monitored. For more information, visit: http://www.aguilar.org/ 5. What if there is a serious reaction? What should I look for? Look for anything that concerns you, such as signs of a severe allergic reaction, very high fever, or unusual behavior. Signs of a severe allergic reaction can include hives, swelling of the face and throat, difficulty breathing, a fast heartbeat, dizziness, and weakness. These would usually start a few minutes to a few hours after the vaccination. What should I do? If you think it is a severe allergic reaction or other emergency that can't wait, call 9-1-1 or get to the nearest hospital. Otherwise, call your doctor. Afterward, the reaction should be reported to the Vaccine Adverse Event Reporting System (VAERS). Your doctor might file this report, or you can do it yourself through the VAERS web site at www.vaers.SamedayNews.es, or by calling (956) 048-4929. VAERS does not give medical advice. 6. How can I learn more?  Ask your doctor. He or she can give you the vaccine package insert or suggest other sources of information.  Call your local or state health department.  Contact the Centers for Disease Control and Prevention (CDC): ? Call (445)288-3518 (1-800-CDC-INFO) or ? Visit CDC's website at http://hunter.com/ CDC Pneumococcal Polysaccharide Vaccine VIS (10/29/13) This information is not intended to replace advice given to you by your health care provider. Make sure you discuss any questions you have with your health care provider. Document Released: 04/21/2006 Document Revised: 03/14/2016 Document Reviewed: 03/14/2016 Elsevier Interactive Patient Education  2017 [employer].

## 2017-11-18 NOTE — Assessment & Plan Note (Signed)
Reviewed ASCVD risk.  She is willing to start on a statin if cholesterol remains the same.

## 2017-11-19 ENCOUNTER — Other Ambulatory Visit: Payer: Self-pay | Admitting: Unknown Physician Specialty

## 2017-11-19 LAB — LIPID PANEL W/O CHOL/HDL RATIO
Cholesterol, Total: 223 mg/dL — ABNORMAL HIGH (ref 100–199)
HDL: 35 mg/dL — ABNORMAL LOW (ref >39)
LDL CALC: 142 mg/dL — AB (ref 0–99)
Triglycerides: 229 mg/dL — ABNORMAL HIGH (ref 0–149)
VLDL Cholesterol Cal: 46 mg/dL — ABNORMAL HIGH (ref 5–40)

## 2017-11-19 LAB — THYROID PANEL WITH TSH
Free Thyroxine Index: 2.1 (ref 1.2–4.9)
T3 Uptake Ratio: 26 % (ref 24–39)
T4 TOTAL: 7.9 ug/dL (ref 4.5–12.0)
TSH: 2.56 u[IU]/mL (ref 0.450–4.500)

## 2017-11-19 LAB — COMPREHENSIVE METABOLIC PANEL
ALBUMIN: 4.4 g/dL (ref 3.6–4.8)
ALK PHOS: 83 IU/L (ref 39–117)
ALT: 17 IU/L (ref 0–32)
AST: 17 IU/L (ref 0–40)
Albumin/Globulin Ratio: 1.5 (ref 1.2–2.2)
BUN / CREAT RATIO: 36 — AB (ref 12–28)
BUN: 36 mg/dL — ABNORMAL HIGH (ref 8–27)
Bilirubin Total: 0.2 mg/dL (ref 0.0–1.2)
CO2: 24 mmol/L (ref 20–29)
CREATININE: 0.99 mg/dL (ref 0.57–1.00)
Calcium: 9.2 mg/dL (ref 8.7–10.3)
Chloride: 102 mmol/L (ref 96–106)
GFR calc Af Amer: 67 mL/min/{1.73_m2} (ref >59)
GFR, EST NON AFRICAN AMERICAN: 58 mL/min/{1.73_m2} — AB (ref >59)
GLOBULIN, TOTAL: 2.9 g/dL (ref 1.5–4.5)
Glucose: 82 mg/dL (ref 65–99)
Potassium: 4.4 mmol/L (ref 3.5–5.2)
Sodium: 139 mmol/L (ref 134–144)
Total Protein: 7.3 g/dL (ref 6.0–8.5)

## 2017-11-19 LAB — HEPATITIS C ANTIBODY

## 2017-11-19 MED ORDER — ATORVASTATIN CALCIUM 20 MG PO TABS: 20.0000 mg | ORAL_TABLET | Freq: Every day | ORAL | 3 refills | Status: DC

## 2017-12-14 ENCOUNTER — Other Ambulatory Visit: Payer: Self-pay | Admitting: Unknown Physician Specialty

## 2017-12-26 ENCOUNTER — Ambulatory Visit (INDEPENDENT_AMBULATORY_CARE_PROVIDER_SITE_OTHER): Payer: Medicare Other

## 2017-12-26 VITALS — BP 112/62 | HR 70 | Temp 98.2°F | Resp 17 | Ht 62.0 in | Wt 195.1 lb

## 2017-12-26 DIAGNOSIS — Z0000 Encounter for general adult medical examination without abnormal findings: Secondary | ICD-10-CM | POA: Diagnosis not present

## 2017-12-26 NOTE — Patient Instructions (Addendum)
Alyssa Bryant , Thank you for taking time to come for your Medicare Wellness Visit. I appreciate your ongoing commitment to your health goals. Please review the following plan we discussed and let me know if I can assist you in the future.   Screening recommendations/referrals: Colonoscopy: completed 11/03/2017   Mammogram: Please call [phone] to schedule your mammogram.  Bone Density:completed 08/15/2005 Recommended yearly ophthalmology/optometry visit for glaucoma screening and checkup Recommended yearly dental visit for hygiene and checkup  Vaccinations: Influenza vaccine: due 03/2018 Pneumococcal vaccine: completed series Tdap vaccine: up to date Shingles vaccine: shingrix eligible, check with your insurance company for coverage information.    Advanced directives: Advance directive discussed with you today. I have provided a copy for you to complete at home and have notarized. Once this is complete please bring a copy in to our office so we can scan it into your chart.  Conditions/risks identified: Recommend drinking at least 6-8 glasses of water a day   Next appointment: Follow up in one year for your annual wellness exam.    Preventive Care 69 Years and Older, Female Preventive care refers to lifestyle choices and visits with your health care provider that can promote health and wellness. What does preventive care include?  A yearly physical exam. This is also called an annual well check.  Dental exams once or twice a year.  Routine eye exams. Ask your health care provider how often you should have your eyes checked.  Personal lifestyle choices, including:  Daily care of your teeth and gums.  Regular physical activity.  Eating a healthy diet.  Avoiding tobacco and drug use.  Limiting alcohol use.  Practicing safe sex.  Taking low-dose aspirin every day.  Taking vitamin and mineral supplements as recommended by your health care provider. What happens during an  annual well check? The services and screenings done by your health care provider during your annual well check will depend on your age, overall health, lifestyle risk factors, and family history of disease. Counseling  Your health care provider may ask you questions about your:  Alcohol use.  Tobacco use.  Drug use.  Emotional well-being.  Home and relationship well-being.  Sexual activity.  Eating habits.  History of falls.  Memory and ability to understand (cognition).  Work and work Statistician.  Reproductive health. Screening  You may have the following tests or measurements:  Height, weight, and BMI.  Blood pressure.  Lipid and cholesterol levels. These may be checked every 5 years, or more frequently if you are over 40 years old.  Skin check.  Lung cancer screening. You may have this screening every year starting at age 69 if you have a 30-pack-year history of smoking and currently smoke or have quit within the past 15 years.  Fecal occult blood test (FOBT) of the stool. You may have this test every year starting at age 69.  Flexible sigmoidoscopy or colonoscopy. You may have a sigmoidoscopy every 5 years or a colonoscopy every 10 years starting at age 69.  Hepatitis C blood test.  Hepatitis B blood test.  Sexually transmitted disease (STD) testing.  Diabetes screening. This is done by checking your blood sugar (glucose) after you have not eaten for a while (fasting). You may have this done every 1-3 years.  Bone density scan. This is done to screen for osteoporosis. You may have this done starting at age 69.  Mammogram. This may be done every 1-2 years. Talk to your health care provider about  how often you should have regular mammograms. Talk with your health care provider about your test results, treatment options, and if necessary, the need for more tests. Vaccines  Your health care provider may recommend certain vaccines, such as:  Influenza  vaccine. This is recommended every year.  Tetanus, diphtheria, and acellular pertussis (Tdap, Td) vaccine. You may need a Td booster every 10 years.  Zoster vaccine. You may need this after age 45.  Pneumococcal 13-valent conjugate (PCV13) vaccine. One dose is recommended after age 69.  Pneumococcal polysaccharide (PPSV23) vaccine. One dose is recommended after age 69. Talk to your health care provider about which screenings and vaccines you need and how often you need them. This information is not intended to replace advice given to you by your health care provider. Make sure you discuss any questions you have with your health care provider. Document Released: 07/21/2015 Document Revised: 03/13/2016 Document Reviewed: 04/25/2015 Elsevier Interactive Patient Education  2017 [place] Prevention in the Home Falls can cause injuries. They can happen to people of all ages. There are many things you can do to make your home safe and to help prevent falls. What can I do on the outside of my home?  Regularly fix the edges of walkways and driveways and fix any cracks.  Remove anything that might make you trip as you walk through a door, such as a raised step or threshold.  Trim any bushes or trees on the path to your home.  Use bright outdoor lighting.  Clear any walking paths of anything that might make someone trip, such as rocks or tools.  Regularly check to see if handrails are loose or broken. Make sure that both sides of any steps have handrails.  Any raised decks and porches should have guardrails on the edges.  Have any leaves, snow, or ice cleared regularly.  Use sand or salt on walking paths during winter.  Clean up any spills in your garage right away. This includes oil or grease spills. What can I do in the bathroom?  Use night lights.  Install grab bars by the toilet and in the tub and shower. Do not use towel bars as grab bars.  Use non-skid mats or decals  in the tub or shower.  If you need to sit down in the shower, use a plastic, non-slip stool.  Keep the floor dry. Clean up any water that spills on the floor as soon as it happens.  Remove soap buildup in the tub or shower regularly.  Attach bath mats securely with double-sided non-slip rug tape.  Do not have throw rugs and other things on the floor that can make you trip. What can I do in the bedroom?  Use night lights.  Make sure that you have a light by your bed that is easy to reach.  Do not use any sheets or blankets that are too big for your bed. They should not hang down onto the floor.  Have a firm chair that has side arms. You can use this for support while you get dressed.  Do not have throw rugs and other things on the floor that can make you trip. What can I do in the kitchen?  Clean up any spills right away.  Avoid walking on wet floors.  Keep items that you use a lot in easy-to-reach places.  If you need to reach something above you, use a strong step stool that has a grab bar.  Keep  electrical cords out of the way.  Do not use floor polish or wax that makes floors slippery. If you must use wax, use non-skid floor wax.  Do not have throw rugs and other things on the floor that can make you trip. What can I do with my stairs?  Do not leave any items on the stairs.  Make sure that there are handrails on both sides of the stairs and use them. Fix handrails that are broken or loose. Make sure that handrails are as long as the stairways.  Check any carpeting to make sure that it is firmly attached to the stairs. Fix any carpet that is loose or worn.  Avoid having throw rugs at the top or bottom of the stairs. If you do have throw rugs, attach them to the floor with carpet tape.  Make sure that you have a light switch at the top of the stairs and the bottom of the stairs. If you do not have them, ask someone to add them for you. What else can I do to help  prevent falls?  Wear shoes that:  Do not have high heels.  Have rubber bottoms.  Are comfortable and fit you well.  Are closed at the toe. Do not wear sandals.  If you use a stepladder:  Make sure that it is fully opened. Do not climb a closed stepladder.  Make sure that both sides of the stepladder are locked into place.  Ask someone to hold it for you, if possible.  Clearly mark and make sure that you can see:  Any grab bars or handrails.  First and last steps.  Where the edge of each step is.  Use tools that help you move around (mobility aids) if they are needed. These include:  Canes.  Walkers.  Scooters.  Crutches.  Turn on the lights when you go into a dark area. Replace any light bulbs as soon as they burn out.  Set up your furniture so you have a clear path. Avoid moving your furniture around.  If any of your floors are uneven, fix them.  If there are any pets around you, be aware of where they are.  Review your medicines with your doctor. Some medicines can make you feel dizzy. This can increase your chance of falling. Ask your doctor what other things that you can do to help prevent falls. This information is not intended to replace advice given to you by your health care provider. Make sure you discuss any questions you have with your health care provider. Document Released: 04/20/2009 Document Revised: 11/30/2015 Document Reviewed: 07/29/2014 Elsevier Interactive Patient Education  2017 [employer].

## 2017-12-26 NOTE — Progress Notes (Signed)
Subjective:   Alyssa Bryant is a 67 y.o. female who presents for Medicare Annual (Subsequent) preventive examination.  Review of Systems:   Cardiac Risk Factors include: advanced age (>39men, >46 women);hypertension;dyslipidemia;obesity (BMI >30kg/m2)     Objective:     Vitals: BP 112/62 (BP Location: Left Arm, Patient Position: Sitting)   Pulse 70   Temp 98.2 F (36.8 C) (Temporal)   Resp 17   Ht 5\' 2"  (1.575 m)   Wt 195 lb 1.6 oz (88.5 kg)   LMP  (LMP Unknown)   BMI 35.68 kg/m   Body mass index is 35.68 kg/m.  Advanced Directives 12/26/2017 11/03/2017 09/03/2016  Does Patient Have a Medical Advance Directive? No No No  Does patient want to make changes to medical advance directive? - No - Patient declined -  Would patient like information on creating a medical advance directive? Yes (MAU/Ambulatory/Procedural Areas - Information given) No - Patient declined -    Tobacco Social History   Tobacco Use  Smoking Status Never Smoker  Smokeless Tobacco Never Used     Counseling given: Not Answered   Clinical Intake:  Pre-visit preparation completed: Yes  Pain : No/denies pain Pain Score: 0-No pain     Nutritional Status: BMI > 30  Obese Nutritional Risks: None Diabetes: No  How often do you need to have someone help you when you read instructions, pamphlets, or other written materials from your doctor or pharmacy?: 1 - Never What is the last grade level you completed in school?: 11th grade  Interpreter Needed?: No  Information entered by :: Tiffany Hill,LPN  Past Medical History:  Diagnosis Date  . Arthritis    back  . Dyspnea   . Hyperlipidemia   . Hypertension   . Hypothyroidism   . Thyroid disease    Past Surgical History:  Procedure Laterality Date  . ABDOMINAL HYSTERECTOMY  1978   partial  . COLONOSCOPY WITH PROPOFOL N/A 11/03/2017   Procedure: COLONOSCOPY WITH PROPOFOL;  Surgeon: [name], MD;  Location: [facility];  Service:  Endoscopy;  Laterality: N/A;   Family History  Problem Relation Age of Onset  . Hyperlipidemia Mother   . Hyperthyroidism Mother   . Cancer Father        lymphoma  . Hypertension Father   . Stroke Sister   . Hypertension Son   . Thyroid disease Son   . Hypertension Sister    Social History   Socioeconomic History  . Marital status: Married    Spouse name: Not on file  . Number of children: Not on file  . Years of education: Not on file  . Highest education level: Not on file  Occupational History  . Not on file  Social Needs  . Financial resource strain: Not hard at all  . Food insecurity:    Worry: Never true    Inability: Never true  . Transportation needs:    Medical: No    Non-medical: No  Tobacco Use  . Smoking status: Never Smoker  . Smokeless tobacco: Never Used  Substance and Sexual Activity  . Alcohol use: No    Alcohol/week: 0.0 oz  . Drug use: No  . Sexual activity: Yes  Lifestyle  . Physical activity:    Days per week: 0 days    Minutes per session: 0 min  . Stress: Not at all  Relationships  . Social connections:    Talks on phone: Once a week    Gets together:  More than three times a week    Attends religious service: More than 4 times per year    Active member of club or organization: No    Attends meetings of clubs or organizations: Never    Relationship status: Married  Other Topics Concern  . Not on file  Social History Narrative  . Not on file    Outpatient Encounter Medications as of 12/26/2017  Medication Sig  . aspirin 81 MG tablet Take 81 mg by mouth daily.  [name] atorvastatin (LIPITOR) 20 MG tablet Take 1 tablet (20 mg total) by mouth daily.  [name] levothyroxine (SYNTHROID, LEVOTHROID) 100 MCG tablet TAKE 1 TABLET BY MOUTH ONCE DAILY BEFORE BREAKFAST  . lisinopril-hydrochlorothiazide (PRINZIDE,ZESTORETIC) 20-12.5 MG tablet TAKE 2 TABLETS BY MOUTH ONCE DAILY  . lisinopril-hydrochlorothiazide (PRINZIDE,ZESTORETIC) 20-12.5 MG tablet Take 2  tablets by mouth daily.  . naproxen (NAPROSYN) 500 MG tablet Take 1 tablet (500 mg total) by mouth 2 (two) times daily with a meal.   No facility-administered encounter medications on file as of 12/26/2017.     Activities of Daily Living In your present state of health, do you have any difficulty performing the following activities: 12/26/2017 11/03/2017  Hearing? Y N  Vision? N N  Difficulty concentrating or making decisions? N N  Walking or climbing stairs? N N  Dressing or bathing? N N  Doing errands, shopping? N -  Preparing Food and eating ? N -  Using the Toilet? N -  In the past six months, have you accidently leaked urine? N -  Do you have problems with loss of bowel control? N -  Managing your Medications? N -  Managing your Finances? N -  Housekeeping or managing your Housekeeping? N -  Some recent data might be hidden    Patient Care Team: Kathrine Haddock, NP as PCP - General (Nurse Practitioner)    Assessment:   This is a routine wellness examination for [place].  Exercise Activities and Dietary recommendations Current Exercise Habits: The patient does not participate in regular exercise at present, Exercise limited by: None identified  Goals    . DIET - INCREASE WATER INTAKE     Recommend drinking at least 6-8 glasses of water a day        Fall Risk Fall Risk  12/26/2017 11/18/2017 09/03/2016  Falls in the past year? No No No   Is the patient's home free of loose throw rugs in walkways, pet beds, electrical cords, etc?   yes      Grab bars in the bathroom? no      Handrails on the stairs?   no      Adequate lighting?   yes  Timed Get Up and Go performed: Completed in 9 seconds with no use of assistive devices, steady gait. No intervention needed at this time.   Depression Screen PHQ 2/9 Scores 12/26/2017 09/03/2016 09/12/2015  PHQ - 2 Score 1 4 1   PHQ- 9 Score - 4 -     Cognitive Function     6CIT Screen 12/26/2017  What Year? 0 points  What month? 0  points  What time? 0 points  Count back from 20 0 points  Months in reverse 0 points  Repeat phrase 2 points  Total Score 2    Immunization History  Administered Date(s) Administered  . Pneumococcal Conjugate-13 09/12/2015  . Pneumococcal Polysaccharide-23 11/18/2017  . Td 09/19/2008    Qualifies for Shingles Vaccine? Yes, discussed shingrix vaccine   Screening  Tests Health Maintenance  Topic Date Due  . MAMMOGRAM  10/05/1998  . INFLUENZA VACCINE  02/05/2018  . TETANUS/TDAP  09/20/2018  . COLONOSCOPY  11/04/2022  . DEXA SCAN  Completed  . Hepatitis C Screening  Completed  . PNA vac Low Risk Adult  Completed    Cancer Screenings: Lung: Low Dose CT Chest recommended if Age 58-80 years, 30 pack-year currently smoking OR have quit w/in 15years. Patient does not qualify. Breast:  Up to date on Mammogram? No  ordered Up to date of Bone Density/Dexa? Yes 08/15/2005 Colorectal: completed 11/03/2017  Additional Screenings:  Hepatitis C Screening: completed 11/18/2017     Plan:    I have personally reviewed and addressed the Medicare Annual Wellness questionnaire and have noted the following in the patient's chart:  A. Medical and social history B. Use of alcohol, tobacco or illicit drugs  C. Current medications and supplements D. Functional ability and status E.  Nutritional status F.  Physical activity G. Advance directives H. List of other physicians I.  Hospitalizations, surgeries, and ER visits in previous 12 months J.  [place] such as hearing and vision if needed, cognitive and depression L. Referrals and appointments   In addition, I have reviewed and discussed with patient certain preventive protocols, quality metrics, and best practice recommendations. A written personalized care plan for preventive services as well as general preventive health recommendations were provided to patient.   Signed,  Tyler Aas, LPN Nurse Health Advisor   Nurse  Notes:none

## 2018-01-21 ENCOUNTER — Ambulatory Visit: Payer: Medicare Other | Admitting: Unknown Physician Specialty

## 2018-02-05 ENCOUNTER — Encounter: Payer: Self-pay | Admitting: Physician Assistant

## 2018-02-05 ENCOUNTER — Ambulatory Visit
Admission: RE | Admit: 2018-02-05 | Discharge: 2018-02-05 | Disposition: A | Discharge status: Home / Self Care | Payer: Medicare Other | Source: Ambulatory Visit | Attending: Physician Assistant | Admitting: Physician Assistant

## 2018-02-05 ENCOUNTER — Ambulatory Visit (INDEPENDENT_AMBULATORY_CARE_PROVIDER_SITE_OTHER): Payer: Medicare Other | Admitting: Physician Assistant

## 2018-02-05 VITALS — BP 131/71 | HR 61 | Temp 98.7°F | Wt 195.2 lb

## 2018-02-05 DIAGNOSIS — M2540 Effusion, unspecified joint: Secondary | ICD-10-CM

## 2018-02-05 DIAGNOSIS — E782 Mixed hyperlipidemia: Secondary | ICD-10-CM | POA: Diagnosis not present

## 2018-02-05 DIAGNOSIS — M79641 Pain in right hand: Secondary | ICD-10-CM | POA: Diagnosis not present

## 2018-02-05 DIAGNOSIS — Z1231 Encounter for screening mammogram for malignant neoplasm of breast: Secondary | ICD-10-CM | POA: Diagnosis not present

## 2018-02-05 DIAGNOSIS — Z1239 Encounter for other screening for malignant neoplasm of breast: Secondary | ICD-10-CM

## 2018-02-05 DIAGNOSIS — M7989 Other specified soft tissue disorders: Secondary | ICD-10-CM | POA: Diagnosis not present

## 2018-02-05 NOTE — Progress Notes (Signed)
   Subjective:    Patient ID: Alyssa Bryant, female    DOB: Jun 21, 1951, 67 y.o.   MRN: [id number]  Alyssa Bryant is a 67 y.o. female presenting on 02/05/2018 for Hand Pain (bilateral, swelling tightness)   HPI  Reports 1-2 weeks of bilateral hand pain. She used to work frequently in [place]. She reports morning stiffness that does not last longer than 1 hour. She denies joint pain or swelling elsewhere. She denies fevers, chills, rashes, weight loss. Denies any injuries.   Social History   Tobacco Use  . Smoking status: Never Smoker  . Smokeless tobacco: Never Used  Substance Use Topics  . Alcohol use: No    Alcohol/week: 0.0 oz  . Drug use: No    Review of Systems Per HPI unless specifically indicated above     Objective:    BP 131/71 (BP Location: Left Arm, Patient Position: Sitting, Cuff Size: Normal)   Pulse 61   Temp 98.7 F (37.1 C)   Wt 195 lb 4 oz (88.6 kg)   LMP  (LMP Unknown)   SpO2 96%   BMI 35.71 kg/m   Wt Readings from Last 3 Encounters:  02/05/18 195 lb 4 oz (88.6 kg)  12/26/17 195 lb 1.6 oz (88.5 kg)  11/18/17 194 lb (88 kg)    Physical Exam  Constitutional: She is oriented to person, place, and time. She appears well-developed and well-nourished.  Cardiovascular: Normal rate and regular rhythm.  Pulmonary/Chest: Effort normal.  Musculoskeletal:  Some minimal swelling of right 2nd MCP though no overlying erythema or bogginess. ROM full. Otherwise, hands appear normal and not disfigured.   Neurological: She is alert and oriented to person, place, and time.  Skin: Skin is warm and dry. No rash noted.  Psychiatric: She has a normal mood and affect. Her behavior is normal.   Results for orders placed or performed in visit on 02/05/18  Lipid Profile  Result Value Ref Range   Cholesterol, Total 120 100 - 199 mg/dL   Triglycerides 153 (H) 0 - 149 mg/dL   HDL 37 (L) >39 mg/dL   VLDL Cholesterol Cal 31 5 - 40 mg/dL   LDL Calculated 52 0 - 99 mg/dL   Chol/HDL Ratio 3.2 0.0 - 4.4 ratio  C-reactive protein  Result Value Ref Range   CRP 10 0 - 10 mg/L  Sed Rate (ESR)  Result Value Ref Range   Sed Rate 14 0 - 40 mm/hr  Rheumatoid Factor  Result Value Ref Range   Rhuematoid fact SerPl-aCnc <10.0 0.0 - 13.9 IU/mL      Assessment & Plan:   1. Breast cancer screening  Given number on AVS to schedule this.   - MM Digital Screening; Future  2. Joint swelling  Suspect OA but will evaluate as below as she has not had issues until now. Could possibly be viral arthritis. Do not suspect lyme disease due to local nature of symptoms. Instructed on use of Tylenol.   - DG Hand Complete Right; Future - DG Hand Complete Left; Future - C-reactive protein - Sed Rate (ESR) - Rheumatoid Factor  3. Mixed hyperlipidemia  Check lipid panel today as she was started on statin at her last visit.   - Lipid Profile    Follow up plan: Return if symptoms worsen or fail to improve.  Carles Collet, PA-C [facility] 02/06/2018, 12:08 PM

## 2018-02-05 NOTE — Patient Instructions (Signed)
[facility]  Address: [address], [place], [place] [address]  Phone: [phone]  

## 2018-02-06 LAB — LIPID PANEL
Chol/HDL Ratio: 3.2 ratio (ref 0.0–4.4)
Cholesterol, Total: 120 mg/dL (ref 100–199)
HDL: 37 mg/dL — ABNORMAL LOW (ref >39)
LDL Calculated: 52 mg/dL (ref 0–99)
Triglycerides: 153 mg/dL — ABNORMAL HIGH (ref 0–149)
VLDL Cholesterol Cal: 31 mg/dL (ref 5–40)

## 2018-02-06 LAB — SEDIMENTATION RATE: Sed Rate: 14 mm/hr (ref 0–40)

## 2018-02-06 LAB — RHEUMATOID FACTOR: Rhuematoid fact SerPl-aCnc: <10 IU/mL (ref 0.0–13.9)

## 2018-02-06 LAB — C-REACTIVE PROTEIN: CRP: 10 mg/L (ref 0–10)

## 2018-02-09 ENCOUNTER — Telehealth: Payer: Self-pay | Admitting: Unknown Physician Specialty

## 2018-02-09 NOTE — Telephone Encounter (Signed)
Called and left patient a VM asking for her to please return my call.  

## 2018-02-09 NOTE — Telephone Encounter (Signed)
Called and notified patient of result notes in another CMA's in basket. See result note in chart.

## 2018-02-09 NOTE — Telephone Encounter (Signed)
Patient states someone left a message on Friday but did not leave a name/number to call back.  She was unsure if it was in regards to the appt she had with Adriana last week.  Please give patient a call back.  Thank you

## 2018-02-09 NOTE — Telephone Encounter (Signed)
Patient returned your call.

## 2018-02-23 ENCOUNTER — Telehealth: Payer: Self-pay | Admitting: Unknown Physician Specialty

## 2018-02-23 DIAGNOSIS — M2540 Effusion, unspecified joint: Secondary | ICD-10-CM

## 2018-02-23 NOTE — Telephone Encounter (Signed)
Copied from [place] [id number]. Topic: Quick Communication - See Telephone Encounter >> Feb 23, 2018  8:42 AM Bea Graff, NT wrote: CRM for notification. See Telephone encounter for: 02/23/18. Pt would like to see if medication can be sent in to the pharmacy for the pain and swelling in her hands. She states that the OTC Tylenol does not help. Please advise. [place] [facility] ([facility]), [place] [facility] [place] [phone] (Phone) [phone] (Fax)

## 2018-02-24 ENCOUNTER — Other Ambulatory Visit: Payer: Self-pay

## 2018-02-24 ENCOUNTER — Ambulatory Visit
Admission: EM | Admit: 2018-02-24 | Discharge: 2018-02-24 | Disposition: A | Discharge status: Home / Self Care | Payer: Medicare Other | Attending: Family Medicine | Admitting: Family Medicine

## 2018-02-24 ENCOUNTER — Encounter: Payer: Self-pay | Admitting: Emergency Medicine

## 2018-02-24 DIAGNOSIS — M79642 Pain in left hand: Secondary | ICD-10-CM

## 2018-02-24 DIAGNOSIS — M659 Unspecified synovitis and tenosynovitis, unspecified site: Secondary | ICD-10-CM

## 2018-02-24 DIAGNOSIS — M79641 Pain in right hand: Secondary | ICD-10-CM | POA: Diagnosis not present

## 2018-02-24 MED ORDER — TRAMADOL HCL 50 MG PO TABS: 50.0000 mg | ORAL_TABLET | Freq: Three times a day (TID) | ORAL | 0 refills | Status: DC | PRN

## 2018-02-24 MED ORDER — PREDNISONE 10 MG PO TABS: ORAL_TABLET | ORAL | 0 refills | Status: DC

## 2018-02-24 NOTE — ED Triage Notes (Signed)
Patient c/o bilateral hand swelling, redness and pain that started 2 weeks ago.

## 2018-02-24 NOTE — Discharge Instructions (Signed)
Medications as prescribed.  Call your primary for a referral to rheumatology.  Take care  Dr. Lacinda Axon

## 2018-02-24 NOTE — ED Provider Notes (Signed)
[facility]    CSN: [id number] Arrival date & time: 02/24/18  1703  History   Chief Complaint Chief Complaint  Patient presents with  . Hand Problem   HPI  67 year old female presents with the above complaint.  Patient reports a 3 to 4-week history of bilateral hand swelling, redness, and pain.  Affects primarily the first and second MCP joints on both hands.  She reports morning stiffness, swelling, and pain.  Lasts for hours.  Patient saw her primary on 8/1.  X-rays were obtained as well as ESR, CRP, and rheumatoid factor.  Work-up was negative.  Patient reports that she continues to have severe pain.  Worse in the morning.  Difficulty with range of motion.  She has been using Tylenol 3 times a day without resolution.  No fevers or chills.  No other associated symptoms.  No other complaints.  Past Medical History:  Diagnosis Date  . Arthritis    back  . Dyspnea   . Hyperlipidemia   . Hypertension   . Hypothyroidism   . Thyroid disease     Patient Active Problem List   Diagnosis Date Noted  . Need for hepatitis C screening test 11/18/2017  . Special screening for malignant neoplasms, colon   . Advanced care planning/counseling discussion 09/03/2016  . Plantar fasciitis 07/10/2016  . Osteopenia 09/05/2015  . Mixed hyperlipidemia 09/05/2015  . Hypothyroidism 09/05/2015  . Hypertension 09/05/2015    Past Surgical History:  Procedure Laterality Date  . ABDOMINAL HYSTERECTOMY  1978   partial  . COLONOSCOPY WITH PROPOFOL N/A 11/03/2017   Procedure: COLONOSCOPY WITH PROPOFOL;  Surgeon: [name], MD;  Location: [facility];  Service: Endoscopy;  Laterality: N/A;    OB History   None      Home Medications    Prior to Admission medications   Medication Sig Start Date End Date Taking? Authorizing Provider  atorvastatin (LIPITOR) 20 MG tablet Take 1 tablet (20 mg total) by mouth daily. 11/19/17  Yes Kathrine Haddock, NP  levothyroxine (SYNTHROID,  LEVOTHROID) 100 MCG tablet TAKE 1 TABLET BY MOUTH ONCE DAILY BEFORE BREAKFAST 12/15/17  Yes Kathrine Haddock, NP  lisinopril-hydrochlorothiazide (PRINZIDE,ZESTORETIC) 20-12.5 MG tablet TAKE 2 TABLETS BY MOUTH ONCE DAILY 10/02/17  Yes Kathrine Haddock, NP  predniSONE (DELTASONE) 10 MG tablet 50 mg daily x 3 days, then 40 mg daily x 3 days, then 30 mg daily x 3 days, then 20 mg daily x 3 days, then 10 mg daily x 3 days. 02/24/18   Coral Spikes, DO  traMADol (ULTRAM) 50 MG tablet Take 1 tablet (50 mg total) by mouth every 8 (eight) hours as needed. 02/24/18   Coral Spikes, DO    Family History Family History  Problem Relation Age of Onset  . Hyperlipidemia Mother   . Hyperthyroidism Mother   . Cancer Father        lymphoma  . Hypertension Father   . Stroke Sister   . Hypertension Son   . Thyroid disease Son   . Hypertension Sister     Social History Social History   Tobacco Use  . Smoking status: Never Smoker  . Smokeless tobacco: Never Used  Substance Use Topics  . Alcohol use: No    Alcohol/week: 0.0 standard drinks  . Drug use: No     Allergies   Sulfa antibiotics   Review of Systems Review of Systems  Constitutional: Negative.   Musculoskeletal: Positive for joint swelling.  Hand pain, swelling, redness, decreased ROM.   Physical Exam Triage Vital Signs ED Triage Vitals  Enc Vitals Group     BP 02/24/18 1716 (!) 122/96     Pulse Rate 02/24/18 1716 79     Resp 02/24/18 1716 18     Temp 02/24/18 1716 98.2 F (36.8 C)     Temp Source 02/24/18 1716 Oral     SpO2 02/24/18 1716 97 %     Weight 02/24/18 1714 170 lb (77.1 kg)     Height 02/24/18 1714 _0  (1.575 m)     Head Circumference --      Peak Flow --      Pain Score 02/24/18 1714 10     Pain Loc --      Pain Edu? --      Excl. in [place]? --    Updated Vital Signs BP (!) 122/96 (BP Location: Left Arm)   Pulse 79   Temp 98.2 F (36.8 C) (Oral)   Resp 18   Ht _1  (1.575 m)   Wt 77.1 kg   LMP  (LMP  Unknown)   SpO2 97%   BMI 31.09 kg/m   Visual Acuity Right Eye Distance:   Left Eye Distance:   Bilateral Distance:    Right Eye Near:   Left Eye Near:    Bilateral Near:     Physical Exam  Constitutional: She is oriented to person, place, and time. She appears well-developed. No distress.  HENT:  Head: Normocephalic and atraumatic.  Eyes: Conjunctivae are normal. No scleral icterus.  Pulmonary/Chest: Effort normal. No respiratory distress.  Musculoskeletal:  Hands -first and second MCP joints on both hands with warmth, erythema, and tenderness.  Decreased range of motion throughout the hand.  Neurological: She is alert and oriented to person, place, and time.  Psychiatric: She has a normal mood and affect. Her behavior is normal.  Nursing note and vitals reviewed.  UC Treatments / Results  Labs (all labs ordered are listed, but only abnormal results are displayed) Labs Reviewed - No data to display  EKG None  Radiology No results found.  Procedures Procedures (including critical care time)  Medications Ordered in UC Medications - No data to display  Initial Impression / Assessment and Plan / UC Course  I have reviewed the triage vital signs and the nursing notes.  Pertinent labs & imaging results that were available during my care of the patient were reviewed by me and considered in my medical decision making (see chart for details).    67 year old female presents with joint pain.  Patient appears to have synovitis.  Her recent work-up was negative.  I am placing her on empiric prednisone.  Advised phone call to her primary care physician to discuss referral to rheumatology.  Tramadol for pain.  Final Clinical Impressions(s) / UC Diagnoses   Final diagnoses:  Synovitis  Pain in both hands     Discharge Instructions     Medications as prescribed.  Call your primary for a referral to rheumatology.  Take care  Dr. Lacinda Axon    ED Prescriptions     Medication Sig Dispense Auth. Provider   predniSONE (DELTASONE) 10 MG tablet 50 mg daily x 3 days, then 40 mg daily x 3 days, then 30 mg daily x 3 days, then 20 mg daily x 3 days, then 10 mg daily x 3 days. 45 tablet Stacie Templin G, DO   traMADol (ULTRAM) 50 MG tablet Take 1  tablet (50 mg total) by mouth every 8 (eight) hours as needed. 15 tablet Coral Spikes, DO     Controlled Substance Prescriptions [place] Controlled Substance Registry consulted? Not Applicable   Coral Spikes, DO 02/24/18 1815

## 2018-02-25 MED ORDER — MELOXICAM 7.5 MG PO TABS: 7.5000 mg | ORAL_TABLET | Freq: Every day | ORAL | 0 refills | Status: DC

## 2018-02-25 NOTE — Telephone Encounter (Signed)
Called pt, no answer. Left voicemail for pt to call us back.

## 2018-02-25 NOTE — Telephone Encounter (Signed)
° ° °  Pt said she went to Urgent care and they gave her 2 medication and she will not pick up the RX that was called in by Carles Collet  But she still want the referral

## 2018-02-25 NOTE — Telephone Encounter (Signed)
Referral to rheumatology placed. Meloxicam sent in, one tab daily.

## 2018-02-25 NOTE — Telephone Encounter (Signed)
Patient wold like a referral to a Rheumatologist for her painful swelling hands.

## 2018-02-27 NOTE — Telephone Encounter (Signed)
Called and spoke to patient. She states she has an appointment with Rheumatology next month.

## 2018-03-12 DIAGNOSIS — M79642 Pain in left hand: Secondary | ICD-10-CM

## 2018-03-12 DIAGNOSIS — M79641 Pain in right hand: Secondary | ICD-10-CM | POA: Diagnosis not present

## 2018-03-16 ENCOUNTER — Ambulatory Visit: Payer: Self-pay | Admitting: *Deleted

## 2018-03-16 NOTE — Telephone Encounter (Signed)
Bleeding with stools. Saw [facility]. [address] in March with external hemorrhoids and was prescribed cream to use. Stated the hemorrhoids and bleeding improved for awhile but over the last few weeks she has noticed more blood with wiping and in the toilet. Denies continuous bleeding/dizziness/fainting with stools. Stated feels some pressure at rectum at times. Denies dark stools/abdominal pain.  Appointment made.  Reason for Disposition . MILD rectal bleeding (more than just a few drops or streaks)  Answer Assessment - Initial Assessment Questions 1. APPEARANCE of BLOOD: "What color is it?" "Is it passed separately, on the surface of the stool, or mixed in with the stool?"      "in the toilet and on the tissue" 2. AMOUNT: "How much blood was passed?"      She is unsure. 3. FREQUENCY: "How many times has blood been passed with the stools?"      Is with every stool 4. ONSET: "When was the blood first seen in the stools?" (Days or weeks)      5 months ago. Increased bleeding over last 3 weeks. 5. DIARRHEA: "Is there also some diarrhea?" If so, ask: "How many diarrhea stools were passed in past 24 hours?"      no 6. CONSTIPATION: "Do you have constipation?" If so, "How bad is it?"     no 7. RECURRENT SYMPTOMS: "Have you had blood in your stools before?" If so, ask: "When was the last time?" and "What happened that time?"      Years ago. 8. BLOOD THINNERS: "Do you take any blood thinners?" (e.g., Coumadin/warfarin, Pradaxa/dabigatran, aspirin)    no 9. OTHER SYMPTOMS: "Do you have any other symptoms?"  (e.g., abdominal pain, vomiting, dizziness, fever)     Denies all  10. PREGNANCY: "Is there any chance you are pregnant?" "When was your last menstrual period?"       no  Protocols used: RECTAL BLEEDING-A-AH

## 2018-03-18 ENCOUNTER — Ambulatory Visit (INDEPENDENT_AMBULATORY_CARE_PROVIDER_SITE_OTHER): Payer: Medicare Other | Admitting: Family Medicine

## 2018-03-18 ENCOUNTER — Other Ambulatory Visit: Payer: Self-pay

## 2018-03-18 ENCOUNTER — Telehealth: Payer: Self-pay | Admitting: Family Medicine

## 2018-03-18 ENCOUNTER — Encounter: Payer: Self-pay | Admitting: Family Medicine

## 2018-03-18 VITALS — BP 115/74 | HR 69 | Temp 98.0°F | Ht 62.0 in | Wt 192.5 lb

## 2018-03-18 DIAGNOSIS — K625 Hemorrhage of anus and rectum: Secondary | ICD-10-CM | POA: Diagnosis not present

## 2018-03-18 MED ORDER — HYDROCORTISONE 2.5 % RE CREA: 1.0000 "application " | TOPICAL_CREAM | Freq: Two times a day (BID) | RECTAL | 6 refills | Status: DC

## 2018-03-18 MED ORDER — HYDROCORTISONE ACETATE 25 MG RE SUPP: 25.0000 mg | Freq: Two times a day (BID) | RECTAL | 6 refills | Status: DC

## 2018-03-18 NOTE — Telephone Encounter (Signed)
Both have been sent, she can double up or just pick up one it doesn't matter  Copied from [place] [phone]. Topic: General - Other >> Mar 18, 2018  4:26 PM Judyann Munson wrote: Reason for CRM:  patient is calling to state instead of the  prescribed cream for  hemorrhoids  and would like a suppository. Please advise

## 2018-03-18 NOTE — Progress Notes (Signed)
BP 115/74   Pulse 69   Temp 98 F (36.7 C) (Oral)   Ht 5\' 2"  (1.575 m)   Wt 192 lb 8 oz (87.3 kg)   LMP  (LMP Unknown)   SpO2 94%   BMI 35.21 kg/m    Subjective:    Patient ID: Alyssa Bryant, female    DOB: 25-May-1951, 67 y.o.   MRN: [id number]  HPI: Alyssa Bryant is a 67 y.o. female  Chief Complaint  Patient presents with  . Rectal Bleeding   Here today for several days of rectal bleeding. Blood when wiping after BM and in toilet. This has been an intermittent issue for about 6-8 months now. Initially improved with use of anusol prn and went months without any bleeding. Has very regular BMs, doesn't ever have to strain. Sometimes feels like there's pressure at the anus but no pain with BMs. Had a recent benign colonoscopy with a hemorrhoid removed during procedure 10/2017. Denies weight loss, fatigue, anemia.   Relevant past medical, surgical, family and social history reviewed and updated as indicated. Interim medical history since our last visit reviewed. Allergies and medications reviewed and updated.  Review of Systems  Per HPI unless specifically indicated above     Objective:    BP 115/74   Pulse 69   Temp 98 F (36.7 C) (Oral)   Ht 5\' 2"  (1.575 m)   Wt 192 lb 8 oz (87.3 kg)   LMP  (LMP Unknown)   SpO2 94%   BMI 35.21 kg/m   Wt Readings from Last 3 Encounters:  03/18/18 192 lb 8 oz (87.3 kg)  02/24/18 170 lb (77.1 kg)  02/05/18 195 lb 4 oz (88.6 kg)    Physical Exam  Constitutional: She is oriented to person, place, and time. She appears well-developed and well-nourished. No distress.  HENT:  Head: Atraumatic.  Eyes: Conjunctivae and EOM are normal.  Neck: Normal range of motion. Neck supple.  Cardiovascular: Normal rate and regular rhythm.  Pulmonary/Chest: Effort normal and breath sounds normal.  Abdominal: Soft. Bowel sounds are normal.  Genitourinary:     Musculoskeletal: Normal range of motion.  Neurological: She is alert and oriented to person,  place, and time.  Skin: Skin is warm and dry.  Psychiatric: She has a normal mood and affect. Her behavior is normal.  Nursing note and vitals reviewed.   Results for orders placed or performed in visit on 03/18/18  CBC with Differential/Platelet  Result Value Ref Range   WBC 7.0 3.4 - 10.8 x10E3/uL   RBC 4.06 3.77 - 5.28 x10E6/uL   Hemoglobin 12.4 11.1 - 15.9 g/dL   Hematocrit 37.4 34.0 - 46.6 %   MCV 92 79 - 97 fL   MCH 30.5 26.6 - 33.0 pg   MCHC 33.2 31.5 - 35.7 g/dL   RDW 14.0 12.3 - 15.4 %   Platelets 340 150 - 450 x10E3/uL   Neutrophils 73 Not Estab. %   Lymphs 18 Not Estab. %   Monocytes 7 Not Estab. %   Eos 0 Not Estab. %   Basos 1 Not Estab. %   Neutrophils Absolute 5.2 1.4 - 7.0 x10E3/uL   Lymphocytes Absolute 1.2 0.7 - 3.1 x10E3/uL   Monocytes Absolute 0.5 0.1 - 0.9 x10E3/uL   EOS (ABSOLUTE) 0.0 0.0 - 0.4 x10E3/uL   Basophils Absolute 0.0 0.0 - 0.2 x10E3/uL   Immature Granulocytes 1 Not Estab. %   Immature Grans (Abs) 0.1 0.0 - 0.1 x10E3/uL  Assessment & Plan:   Problem List Items Addressed This Visit    None    Visit Diagnoses    Rectal bleeding    -  Primary   Recent colonoscopy reassuring, obvious irritated hemorrhoid likely cause. Will check CBC today and restart anusol regimen   Relevant Orders   CBC with Differential/Platelet (Completed)       Follow up plan: Return for as scheduled.

## 2018-03-19 LAB — CBC WITH DIFFERENTIAL/PLATELET
Basophils Absolute: 0 10*3/uL (ref 0.0–0.2)
Basos: 1 %
EOS (ABSOLUTE): 0 10*3/uL (ref 0.0–0.4)
EOS: 0 %
HEMATOCRIT: 37.4 % (ref 34.0–46.6)
HEMOGLOBIN: 12.4 g/dL (ref 11.1–15.9)
IMMATURE GRANS (ABS): 0.1 10*3/uL (ref 0.0–0.1)
IMMATURE GRANULOCYTES: 1 %
LYMPHS ABS: 1.2 10*3/uL (ref 0.7–3.1)
LYMPHS: 18 %
MCH: 30.5 pg (ref 26.6–33.0)
MCHC: 33.2 g/dL (ref 31.5–35.7)
MCV: 92 fL (ref 79–97)
MONOCYTES: 7 %
Monocytes Absolute: 0.5 10*3/uL (ref 0.1–0.9)
Neutrophils Absolute: 5.2 10*3/uL (ref 1.4–7.0)
Neutrophils: 73 %
Platelets: 340 10*3/uL (ref 150–450)
RBC: 4.06 x10E6/uL (ref 3.77–5.28)
RDW: 14 % (ref 12.3–15.4)
WBC: 7 10*3/uL (ref 3.4–10.8)

## 2018-03-19 NOTE — Telephone Encounter (Signed)
Pt returned call. Pt notified prescriptions have been sent to the pharmacy. Pt stated that she was told that insurance might not cover the suppository. Advised pt give [place] a call if the insurance does not want to pay the suppository. Pt verbalized understanding.

## 2018-03-19 NOTE — Telephone Encounter (Signed)
Called pt, no answer. Left Voicemail for pt to return call.

## 2018-03-29 NOTE — Patient Instructions (Signed)
Follow up as scheduled.  

## 2018-05-22 ENCOUNTER — Ambulatory Visit: Payer: Medicare Other | Admitting: Unknown Physician Specialty

## 2018-06-12 ENCOUNTER — Other Ambulatory Visit: Payer: Self-pay | Admitting: Unknown Physician Specialty

## 2018-06-17 DIAGNOSIS — M79642 Pain in left hand: Secondary | ICD-10-CM | POA: Diagnosis not present

## 2018-06-17 DIAGNOSIS — M1990 Unspecified osteoarthritis, unspecified site: Secondary | ICD-10-CM | POA: Diagnosis not present

## 2018-06-17 DIAGNOSIS — M79641 Pain in right hand: Secondary | ICD-10-CM | POA: Diagnosis not present

## 2018-09-14 ENCOUNTER — Ambulatory Visit (INDEPENDENT_AMBULATORY_CARE_PROVIDER_SITE_OTHER): Payer: Medicare Other | Admitting: Family Medicine

## 2018-09-14 ENCOUNTER — Encounter: Payer: Self-pay | Admitting: Family Medicine

## 2018-09-14 VITALS — BP 124/74 | HR 82 | Temp 97.6°F | Ht 62.0 in | Wt 197.3 lb

## 2018-09-14 DIAGNOSIS — J069 Acute upper respiratory infection, unspecified: Secondary | ICD-10-CM | POA: Diagnosis not present

## 2018-09-14 DIAGNOSIS — H66002 Acute suppurative otitis media without spontaneous rupture of ear drum, left ear: Secondary | ICD-10-CM | POA: Diagnosis not present

## 2018-09-14 MED ORDER — HYDROCOD POLST-CPM POLST ER 10-8 MG/5ML PO SUER: 5.0000 mL | Freq: Every evening | ORAL | 0 refills | Status: DC | PRN

## 2018-09-14 MED ORDER — KETOCONAZOLE 2 % EX CREA: 1.0000 "application " | TOPICAL_CREAM | Freq: Two times a day (BID) | CUTANEOUS | 0 refills | Status: DC

## 2018-09-14 MED ORDER — AZITHROMYCIN 250 MG PO TABS: ORAL_TABLET | ORAL | 0 refills | Status: DC

## 2018-09-14 NOTE — Progress Notes (Signed)
BP 124/74 (BP Location: Left Arm, Patient Position: Sitting, Cuff Size: Normal)   Pulse 82   Temp 97.6 F (36.4 C) (Oral)   Ht 5\' 2"  (1.575 m)   Wt 197 lb 4.8 oz (89.5 kg)   LMP  (LMP Unknown)   SpO2 92%   BMI 36.09 kg/m    Subjective:    Patient ID: Alyssa Bryant, female    DOB: 05-01-1951, 68 y.o.   MRN: [id number]  HPI: Alyssa Bryant is a 68 y.o. female  Chief Complaint  Patient presents with  . Cough    Ongoing 2 weeks.   . Ear Pain    Onset yesterday. Patient states it has kept her up all night.   . Nasal Congestion   About 2 weeks of congestion, productive cough, chest tightness. Now since yesterday having left ear pain, tinnitus, pressure. Denies fevers, chills, CP, SOB. Tried OTC cough and congestion products without relief. Hx of allergic rhinitis.   Relevant past medical, surgical, family and social history reviewed and updated as indicated. Interim medical history since our last visit reviewed. Allergies and medications reviewed and updated.  Review of Systems  Per HPI unless specifically indicated above     Objective:    BP 124/74 (BP Location: Left Arm, Patient Position: Sitting, Cuff Size: Normal)   Pulse 82   Temp 97.6 F (36.4 C) (Oral)   Ht 5\' 2"  (1.575 m)   Wt 197 lb 4.8 oz (89.5 kg)   LMP  (LMP Unknown)   SpO2 92%   BMI 36.09 kg/m   Wt Readings from Last 3 Encounters:  09/16/18 197 lb (89.4 kg)  09/14/18 197 lb 4.8 oz (89.5 kg)  03/18/18 192 lb 8 oz (87.3 kg)    Physical Exam Vitals signs and nursing note reviewed.  Constitutional:      Appearance: Normal appearance. She is not ill-appearing.  HENT:     Head: Atraumatic.     Ears:     Comments: Right middle ear effusion Left TM bulging with purulent fluid. TM injected    Nose: Congestion present.     Mouth/Throat:     Pharynx: Posterior oropharyngeal erythema present.  Eyes:     Extraocular Movements: Extraocular movements intact.     Conjunctiva/sclera: Conjunctivae normal.  Neck:       Musculoskeletal: Normal range of motion and neck supple.  Cardiovascular:     Rate and Rhythm: Normal rate and regular rhythm.     Heart sounds: Normal heart sounds.  Pulmonary:     Effort: Pulmonary effort is normal.     Breath sounds: Normal breath sounds.  Musculoskeletal: Normal range of motion.  Skin:    General: Skin is warm and dry.  Neurological:     Mental Status: She is alert and oriented to person, place, and time.  Psychiatric:        Mood and Affect: Mood normal.        Thought Content: Thought content normal.        Judgment: Judgment normal.     Results for orders placed or performed in visit on 03/18/18  CBC with Differential/Platelet  Result Value Ref Range   WBC 7.0 3.4 - 10.8 x10E3/uL   RBC 4.06 3.77 - 5.28 x10E6/uL   Hemoglobin 12.4 11.1 - 15.9 g/dL   Hematocrit 37.4 34.0 - 46.6 %   MCV 92 79 - 97 fL   MCH 30.5 26.6 - 33.0 pg   MCHC 33.2 31.5 - 35.7 g/dL  RDW 14.0 12.3 - 15.4 %   Platelets 340 150 - 450 x10E3/uL   Neutrophils 73 Not Estab. %   Lymphs 18 Not Estab. %   Monocytes 7 Not Estab. %   Eos 0 Not Estab. %   Basos 1 Not Estab. %   Neutrophils Absolute 5.2 1.4 - 7.0 x10E3/uL   Lymphocytes Absolute 1.2 0.7 - 3.1 x10E3/uL   Monocytes Absolute 0.5 0.1 - 0.9 x10E3/uL   EOS (ABSOLUTE) 0.0 0.0 - 0.4 x10E3/uL   Basophils Absolute 0.0 0.0 - 0.2 x10E3/uL   Immature Granulocytes 1 Not Estab. %   Immature Grans (Abs) 0.1 0.0 - 0.1 x10E3/uL      Assessment & Plan:   Problem List Items Addressed This Visit    None    Visit Diagnoses    Upper respiratory tract infection, unspecified type    -  Primary   Tx with zpak, flonase, sinus rinses, supportive care. F/u if worsening or not improving   Relevant Medications   azithromycin (ZITHROMAX) 250 MG tablet   ketoconazole (NIZORAL) 2 % cream   Acute suppurative otitis media of left ear without spontaneous rupture of tympanic membrane, recurrence not specified       Relevant Medications    azithromycin (ZITHROMAX) 250 MG tablet       Follow up plan: Return if symptoms worsen or fail to improve.

## 2018-09-16 ENCOUNTER — Encounter: Payer: Self-pay | Admitting: Family Medicine

## 2018-09-16 ENCOUNTER — Other Ambulatory Visit: Payer: Self-pay

## 2018-09-16 ENCOUNTER — Telehealth: Payer: Self-pay | Admitting: Family Medicine

## 2018-09-16 ENCOUNTER — Ambulatory Visit (INDEPENDENT_AMBULATORY_CARE_PROVIDER_SITE_OTHER): Payer: Medicare Other | Admitting: Family Medicine

## 2018-09-16 ENCOUNTER — Ambulatory Visit: Payer: Self-pay | Admitting: Family Medicine

## 2018-09-16 VITALS — BP 127/75 | HR 81 | Temp 98.2°F | Ht 62.0 in | Wt 197.0 lb

## 2018-09-16 DIAGNOSIS — R42 Dizziness and giddiness: Secondary | ICD-10-CM

## 2018-09-16 DIAGNOSIS — H66012 Acute suppurative otitis media with spontaneous rupture of ear drum, left ear: Secondary | ICD-10-CM

## 2018-09-16 MED ORDER — FLUTICASONE PROPIONATE 50 MCG/ACT NA SUSP
2.0000 | Freq: Two times a day (BID) | NASAL | 1 refills | Status: DC
Start: 2018-09-16 — End: 2018-10-22

## 2018-09-16 MED ORDER — PREDNISONE 20 MG PO TABS
40.0000 mg | ORAL_TABLET | Freq: Every day | ORAL | 0 refills | Status: DC
Start: 2018-09-16 — End: 2018-10-22

## 2018-09-16 NOTE — Progress Notes (Signed)
BP 127/75   Pulse 81   Temp 98.2 F (36.8 C) (Oral)   Ht 5\' 2"  (1.575 m)   Wt 197 lb (89.4 kg)   LMP  (LMP Unknown)   SpO2 96%   BMI 36.03 kg/m    Subjective:    Patient ID: Alyssa Bryant, female    DOB: 21-Sep-1950, 68 y.o.   MRN: [id number]  HPI: Alyssa Bryant is a 68 y.o. female  Chief Complaint  Patient presents with  . Ear Pain   Here today for worsening left ear pain, pressure, and now drainage and significantly decreased hearing. Also having some dizziness with movement as well. Has been on zpak x 2 days with no improvement. Trying flonase as well. Denies fevers, chills, sweats, headache, CP, SOB.     Relevant past medical, surgical, family and social history reviewed and updated as indicated. Interim medical history since our last visit reviewed. Allergies and medications reviewed and updated.  Review of Systems  Per HPI unless specifically indicated above     Objective:    BP 127/75   Pulse 81   Temp 98.2 F (36.8 C) (Oral)   Ht 5\' 2"  (1.575 m)   Wt 197 lb (89.4 kg)   LMP  (LMP Unknown)   SpO2 96%   BMI 36.03 kg/m   Wt Readings from Last 3 Encounters:  09/16/18 197 lb (89.4 kg)  09/14/18 197 lb 4.8 oz (89.5 kg)  03/18/18 192 lb 8 oz (87.3 kg)    Physical Exam Vitals signs and nursing note reviewed.  Constitutional:      Appearance: Normal appearance. She is not ill-appearing.  HENT:     Head: Atraumatic.     Right Ear: Tympanic membrane and external ear normal.     Ears:     Comments: Appears to be a small perforation in left TM with some dried fluid within left EAC.  Eyes:     Extraocular Movements: Extraocular movements intact.     Conjunctiva/sclera: Conjunctivae normal.  Neck:     Musculoskeletal: Normal range of motion and neck supple.  Cardiovascular:     Rate and Rhythm: Normal rate and regular rhythm.     Heart sounds: Normal heart sounds.  Pulmonary:     Effort: Pulmonary effort is normal.     Breath sounds: Normal breath sounds.   Musculoskeletal: Normal range of motion.  Skin:    General: Skin is warm and dry.  Neurological:     General: No focal deficit present.     Mental Status: She is alert and oriented to person, place, and time. Mental status is at baseline.     Gait: Gait normal.  Psychiatric:        Mood and Affect: Mood normal.        Thought Content: Thought content normal.        Judgment: Judgment normal.     Results for orders placed or performed in visit on 03/18/18  CBC with Differential/Platelet  Result Value Ref Range   WBC 7.0 3.4 - 10.8 x10E3/uL   RBC 4.06 3.77 - 5.28 x10E6/uL   Hemoglobin 12.4 11.1 - 15.9 g/dL   Hematocrit 37.4 34.0 - 46.6 %   MCV 92 79 - 97 fL   MCH 30.5 26.6 - 33.0 pg   MCHC 33.2 31.5 - 35.7 g/dL   RDW 14.0 12.3 - 15.4 %   Platelets 340 150 - 450 x10E3/uL   Neutrophils 73 Not Estab. %   Lymphs  18 Not Estab. %   Monocytes 7 Not Estab. %   Eos 0 Not Estab. %   Basos 1 Not Estab. %   Neutrophils Absolute 5.2 1.4 - 7.0 x10E3/uL   Lymphocytes Absolute 1.2 0.7 - 3.1 x10E3/uL   Monocytes Absolute 0.5 0.1 - 0.9 x10E3/uL   EOS (ABSOLUTE) 0.0 0.0 - 0.4 x10E3/uL   Basophils Absolute 0.0 0.0 - 0.2 x10E3/uL   Immature Granulocytes 1 Not Estab. %   Immature Grans (Abs) 0.1 0.0 - 0.1 x10E3/uL      Assessment & Plan:   Problem List Items Addressed This Visit    None    Visit Diagnoses    Acute suppurative otitis media of left ear with spontaneous rupture of tympanic membrane, recurrence not specified    -  Primary   Will add prednisone given persistently worsening ear pressure and pain. Continue zpak, keep cotton in ear for protection   Dizziness       Suspect from inner ear pressure from infection. Monitor closely for improvement with more aggressive tx of ear infection. Do not drive until improved       Follow up plan: Return if symptoms worsen or fail to improve.

## 2018-09-16 NOTE — Telephone Encounter (Signed)
Incoming call from Patient with complaint of fullness sensation and  Cant hear out of left  Left  ear . Was  Seen 09/14/18 .  Patient states Pain is  rated  severe.  Reports"popping and roaring sounds". Patient scheduled for appointment today @ 11:15.  Patient is to arrive @ 11: 00 Voices understanding.        Alyssa Bryant Female, 68 y.o., 27-Jul-1950 MRN:  [id number] Phone:  [phone] Alyssa Bryant) PCP:  [name], PA-C Primary Cvg:  [place] [facility] 12/28/2018 at 2:30 PM Message from [name] sent at 09/16/2018 8:30 AM EDT   Summary: left ear full    Pt called and stated that he left ear is full and cant hear out of it. Pt would like to know what she can do. Please advise Pt was seen recently on 09/14/18        Call History    Type Contact  09/16/2018 08:40 AM Phone (Outgoing) [name] (Self)  Phone: [phone] (H)  User: Marijo Conception, RN  Left Message   09/16/2018 08:29 AM Phone (Incoming) [name] (Self)  Phone: [phone] (H)  User: [name]  Encounter Report   Patient Encounter Report    Reason for Disposition . Earache persists > 1 hour  Answer Assessment - Initial Assessment Questions 1. LOCATION: "Which ear is involved?"       Left  ear 2. SENSATION: "Describe how the ear feels."      Roar and popping  Sounds like a train 3. ONSET:  "When did the ear symptoms start?"       Sunday 4. PAIN: "Do you also have an earache?" If so, ask: "How bad is it?" (Scale 1-10; or mild, moderate, severe)     Severe 5. CAUSE: "What do you think is causing the ear congestion?"     *No Answer* 6. URI: "Do you have a runny nose or cough?"     Coughing until I  Loose my breath 7. NASAL ALLERGIES: "Are there symptoms of hay fever, such as sneezing or a clear nasal discharge?"     Denies ,  Head is full of congestion 8. PREGNANCY: "Is there any chance you are pregnant?" "When was your last  menstrual period?"     Denies  Protocols used: EAR - CONGESTION-A-AH

## 2018-09-16 NOTE — Telephone Encounter (Signed)
Pt was seen in office this morning  Copied from [place] #[id number]. Topic: General - Other >> Sep 16, 2018  7:21 AM Carolyn Stare wrote:  Pt was in on Monday 09/14/2018 and call today to say her ear is roaring and is asking if maybe she need to have her ear cleaned out >> Sep 16, 2018  7:53 AM Linard Millers E wrote: Please advise if we need to make pt an appt  Thank you

## 2018-09-22 ENCOUNTER — Other Ambulatory Visit: Payer: Self-pay | Admitting: Otolaryngology

## 2018-09-22 ENCOUNTER — Ambulatory Visit
Admission: RE | Admit: 2018-09-22 | Discharge: 2018-09-22 | Disposition: A | Discharge status: Home / Self Care | Payer: Medicare Other | Source: Ambulatory Visit | Attending: Otolaryngology | Admitting: Otolaryngology

## 2018-09-22 DIAGNOSIS — R059 Cough, unspecified: Secondary | ICD-10-CM

## 2018-09-22 DIAGNOSIS — R05 Cough: Secondary | ICD-10-CM

## 2018-09-28 ENCOUNTER — Telehealth: Payer: Self-pay | Admitting: Family Medicine

## 2018-09-28 NOTE — Telephone Encounter (Signed)
Copied from [place] [phone]. Topic: Medicare AWV >> Sep 28, 2018 11:30 AM Sherren Kerns wrote: Called to RESCHEDULE Medicare Annual Wellness Visit with the Nurse Health Advisor, DUE to change in [facility] schedule.    If patient returns call, please note: their last AWV was on 12/26/2017, please schedule AWV with [facility] any date AFTER 12/27/2018.  Thank you! For any questions please contact: Janace Hoard at [phone] or Skype [email or web address]  Appointment desk notes show patient called 09/28/2018 at 11:37AM and rescheduled AWV for 12/30/2018 at 1:00 PM.  Janace Hoard, Care Guide.

## 2018-10-22 ENCOUNTER — Other Ambulatory Visit: Payer: Self-pay

## 2018-10-22 ENCOUNTER — Telehealth: Payer: Self-pay | Admitting: Family Medicine

## 2018-10-22 ENCOUNTER — Encounter: Payer: Self-pay | Admitting: Family Medicine

## 2018-10-22 ENCOUNTER — Ambulatory Visit (INDEPENDENT_AMBULATORY_CARE_PROVIDER_SITE_OTHER): Payer: Medicare Other | Admitting: Family Medicine

## 2018-10-22 VITALS — BP 108/73 | HR 76 | Temp 98.1°F | Ht 62.0 in | Wt 198.0 lb

## 2018-10-22 DIAGNOSIS — M545 Low back pain, unspecified: Secondary | ICD-10-CM

## 2018-10-22 MED ORDER — CYCLOBENZAPRINE HCL 5 MG PO TABS: 5.0000 mg | ORAL_TABLET | Freq: Three times a day (TID) | ORAL | 0 refills | Status: DC | PRN

## 2018-10-22 MED ORDER — KETOROLAC TROMETHAMINE 60 MG/2ML IM SOLN
60.0000 mg | Freq: Once | INTRAMUSCULAR | Status: AC
  Administered 2018-10-22: 60 mg via INTRAMUSCULAR
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
## 2018-10-22 NOTE — Telephone Encounter (Signed)
If she can come this afternoon that would be best, but phone if not
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
## 2018-10-22 NOTE — Telephone Encounter (Signed)
I put [place] on the schedule for 3. LVM for her to call back to confirm

## 2018-10-27 DIAGNOSIS — B078 Other viral warts: Secondary | ICD-10-CM | POA: Diagnosis not present

## 2018-10-27 DIAGNOSIS — L84 Corns and callosities: Secondary | ICD-10-CM | POA: Diagnosis not present

## 2018-11-04 ENCOUNTER — Other Ambulatory Visit: Payer: Self-pay | Admitting: Unknown Physician Specialty

## 2018-11-04 NOTE — Telephone Encounter (Signed)
Last ordered 10/02/17 qty #60

## 2018-11-05 NOTE — Telephone Encounter (Signed)
Looks like we haven't done a regular f/u in quite some time - only acutes, please get her scheduled for one and I'll bridge her medication for the next month

## 2018-11-05 NOTE — Telephone Encounter (Signed)
Scheduled Telephone visit 11/18/2018

## 2018-11-17 DIAGNOSIS — B078 Other viral warts: Secondary | ICD-10-CM | POA: Diagnosis not present

## 2018-11-18 ENCOUNTER — Ambulatory Visit: Payer: Medicare Other | Admitting: Family Medicine

## 2018-11-25 ENCOUNTER — Encounter: Payer: Self-pay | Admitting: Family Medicine

## 2018-11-25 ENCOUNTER — Ambulatory Visit (INDEPENDENT_AMBULATORY_CARE_PROVIDER_SITE_OTHER): Payer: Medicare Other | Admitting: Family Medicine

## 2018-11-25 ENCOUNTER — Other Ambulatory Visit: Payer: Self-pay

## 2018-11-25 VITALS — BP 110/72 | HR 83

## 2018-11-25 DIAGNOSIS — I10 Essential (primary) hypertension: Secondary | ICD-10-CM

## 2018-11-25 DIAGNOSIS — E782 Mixed hyperlipidemia: Secondary | ICD-10-CM | POA: Diagnosis not present

## 2018-11-25 DIAGNOSIS — M545 Low back pain: Secondary | ICD-10-CM | POA: Diagnosis not present

## 2018-11-25 DIAGNOSIS — E038 Other specified hypothyroidism: Secondary | ICD-10-CM

## 2018-11-25 DIAGNOSIS — G8929 Other chronic pain: Secondary | ICD-10-CM
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
## 2018-12-01 NOTE — Assessment & Plan Note (Signed)
Recheck lipids, adjust as needed.  Continue current regimen and good lifestyle modifications in meantime

## 2018-12-01 NOTE — Assessment & Plan Note (Addendum)
Stable and WNL, continue current regimen 

## 2018-12-01 NOTE — Assessment & Plan Note (Signed)
Recheck TSH at upcoming lab visit. Continue current regimen and adjust as needed

## 2018-12-02 ENCOUNTER — Telehealth: Payer: Self-pay | Admitting: Family Medicine

## 2018-12-02 NOTE — Telephone Encounter (Signed)
Called pt to go over Covid-19 screening and set up CPE no answer, left voicemail.

## 2018-12-03 ENCOUNTER — Other Ambulatory Visit: Payer: Medicare Other

## 2018-12-04 ENCOUNTER — Other Ambulatory Visit: Payer: Self-pay | Admitting: Family Medicine

## 2018-12-15 ENCOUNTER — Other Ambulatory Visit: Payer: Self-pay | Admitting: Unknown Physician Specialty

## 2018-12-16 DIAGNOSIS — B078 Other viral warts: Secondary | ICD-10-CM | POA: Diagnosis not present

## 2018-12-28 ENCOUNTER — Ambulatory Visit: Payer: Medicare Other

## 2018-12-30 ENCOUNTER — Ambulatory Visit (INDEPENDENT_AMBULATORY_CARE_PROVIDER_SITE_OTHER): Payer: Medicare Other

## 2018-12-30 VITALS — BP 148/84 | HR 74

## 2018-12-30 DIAGNOSIS — Z0000 Encounter for general adult medical examination without abnormal findings: Secondary | ICD-10-CM

## 2018-12-30 NOTE — Patient Instructions (Signed)
Ms. Alyssa Bryant , Thank you for taking time to come for your Medicare Wellness Visit. I appreciate your ongoing commitment to your health goals. Please review the following plan we discussed and let me know if I can assist you in the future.   Screening recommendations/referrals: Colonoscopy: completed 10/2017, repeat 5 years Mammogram: due, check with your insurance- you should be eligible for screening yearly Bone Density: completed  Recommended yearly ophthalmology/optometry visit for glaucoma screening and checkup Recommended yearly dental visit for hygiene and checkup  Vaccinations: Influenza vaccine: up to date Pneumococcal vaccine: up to date Tdap vaccine: due, check with your insurance company for coverage  Shingles vaccine: shignrix eligible, check with your insurance company for coverage     Advanced directives: please pick up a copy of the paperwork next time you are in the office  Conditions/risks identified: none  Next appointment: follow up in one year for your annual wellness exam.    Preventive Care 65 Years and Older, Female Preventive care refers to lifestyle choices and visits with your health care provider that can promote health and wellness. What does preventive care include?  A yearly physical exam. This is also called an annual well check.  Dental exams once or twice a year.  Routine eye exams. Ask your health care provider how often you should have your eyes checked.  Personal lifestyle choices, including:  Daily care of your teeth and gums.  Regular physical activity.  Eating a healthy diet.  Avoiding tobacco and drug use.  Limiting alcohol use.  Practicing safe sex.  Taking low-dose aspirin every day.  Taking vitamin and mineral supplements as recommended by your health care provider. What happens during an annual well check? The services and screenings done by your health care provider during your annual well check will depend on your age,  overall health, lifestyle risk factors, and family history of disease. Counseling  Your health care provider may ask you questions about your:  Alcohol use.  Tobacco use.  Drug use.  Emotional well-being.  Home and relationship well-being.  Sexual activity.  Eating habits.  History of falls.  Memory and ability to understand (cognition).  Work and work Statistician.  Reproductive health. Screening  You may have the following tests or measurements:  Height, weight, and BMI.  Blood pressure.  Lipid and cholesterol levels. These may be checked every 5 years, or more frequently if you are over 49 years old.  Skin check.  Lung cancer screening. You may have this screening every year starting at age 11 if you have a 30-pack-year history of smoking and currently smoke or have quit within the past 15 years.  Fecal occult blood test (FOBT) of the stool. You may have this test every year starting at age 50.  Flexible sigmoidoscopy or colonoscopy. You may have a sigmoidoscopy every 5 years or a colonoscopy every 10 years starting at age 11.  Hepatitis C blood test.  Hepatitis B blood test.  Sexually transmitted disease (STD) testing.  Diabetes screening. This is done by checking your blood sugar (glucose) after you have not eaten for a while (fasting). You may have this done every 1-3 years.  Bone density scan. This is done to screen for osteoporosis. You may have this done starting at age 67.  Mammogram. This may be done every 1-2 years. Talk to your health care provider about how often you should have regular mammograms. Talk with your health care provider about your test results, treatment options, and if  necessary, the need for more tests. Vaccines  Your health care provider may recommend certain vaccines, such as:  Influenza vaccine. This is recommended every year.  Tetanus, diphtheria, and acellular pertussis (Tdap, Td) vaccine. You may need a Td booster every 10  years.  Zoster vaccine. You may need this after age 20.  Pneumococcal 13-valent conjugate (PCV13) vaccine. One dose is recommended after age 62.  Pneumococcal polysaccharide (PPSV23) vaccine. One dose is recommended after age 70. Talk to your health care provider about which screenings and vaccines you need and how often you need them. This information is not intended to replace advice given to you by your health care provider. Make sure you discuss any questions you have with your health care provider. Document Released: 07/21/2015 Document Revised: 03/13/2016 Document Reviewed: 04/25/2015 Elsevier Interactive Patient Education  2017 [place] Prevention in the Home Falls can cause injuries. They can happen to people of all ages. There are many things you can do to make your home safe and to help prevent falls. What can I do on the outside of my home?  Regularly fix the edges of walkways and driveways and fix any cracks.  Remove anything that might make you trip as you walk through a door, such as a raised step or threshold.  Trim any bushes or trees on the path to your home.  Use bright outdoor lighting.  Clear any walking paths of anything that might make someone trip, such as rocks or tools.  Regularly check to see if handrails are loose or broken. Make sure that both sides of any steps have handrails.  Any raised decks and porches should have guardrails on the edges.  Have any leaves, snow, or ice cleared regularly.  Use sand or salt on walking paths during winter.  Clean up any spills in your garage right away. This includes oil or grease spills. What can I do in the bathroom?  Use night lights.  Install grab bars by the toilet and in the tub and shower. Do not use towel bars as grab bars.  Use non-skid mats or decals in the tub or shower.  If you need to sit down in the shower, use a plastic, non-slip stool.  Keep the floor dry. Clean up any water that  spills on the floor as soon as it happens.  Remove soap buildup in the tub or shower regularly.  Attach bath mats securely with double-sided non-slip rug tape.  Do not have throw rugs and other things on the floor that can make you trip. What can I do in the bedroom?  Use night lights.  Make sure that you have a light by your bed that is easy to reach.  Do not use any sheets or blankets that are too big for your bed. They should not hang down onto the floor.  Have a firm chair that has side arms. You can use this for support while you get dressed.  Do not have throw rugs and other things on the floor that can make you trip. What can I do in the kitchen?  Clean up any spills right away.  Avoid walking on wet floors.  Keep items that you use a lot in easy-to-reach places.  If you need to reach something above you, use a strong step stool that has a grab bar.  Keep electrical cords out of the way.  Do not use floor polish or wax that makes floors slippery. If you must  use wax, use non-skid floor wax.  Do not have throw rugs and other things on the floor that can make you trip. What can I do with my stairs?  Do not leave any items on the stairs.  Make sure that there are handrails on both sides of the stairs and use them. Fix handrails that are broken or loose. Make sure that handrails are as long as the stairways.  Check any carpeting to make sure that it is firmly attached to the stairs. Fix any carpet that is loose or worn.  Avoid having throw rugs at the top or bottom of the stairs. If you do have throw rugs, attach them to the floor with carpet tape.  Make sure that you have a light switch at the top of the stairs and the bottom of the stairs. If you do not have them, ask someone to add them for you. What else can I do to help prevent falls?  Wear shoes that:  Do not have high heels.  Have rubber bottoms.  Are comfortable and fit you well.  Are closed at the  toe. Do not wear sandals.  If you use a stepladder:  Make sure that it is fully opened. Do not climb a closed stepladder.  Make sure that both sides of the stepladder are locked into place.  Ask someone to hold it for you, if possible.  Clearly mark and make sure that you can see:  Any grab bars or handrails.  First and last steps.  Where the edge of each step is.  Use tools that help you move around (mobility aids) if they are needed. These include:  Canes.  Walkers.  Scooters.  Crutches.  Turn on the lights when you go into a dark area. Replace any light bulbs as soon as they burn out.  Set up your furniture so you have a clear path. Avoid moving your furniture around.  If any of your floors are uneven, fix them.  If there are any pets around you, be aware of where they are.  Review your medicines with your doctor. Some medicines can make you feel dizzy. This can increase your chance of falling. Ask your doctor what other things that you can do to help prevent falls. This information is not intended to replace advice given to you by your health care provider. Make sure you discuss any questions you have with your health care provider. Document Released: 04/20/2009 Document Revised: 11/30/2015 Document Reviewed: 07/29/2014 Elsevier Interactive Patient Education  2017 [employer].

## 2018-12-30 NOTE — Progress Notes (Signed)
Subjective:   Alyssa Bryant is a 68 y.o. female who presents for Medicare Annual (Subsequent) preventive examination.  This visit is being conducted via phone call  - after an attmept to do on video chat - due to the COVID-19 pandemic. This patient has given me verbal consent via phone to conduct this visit, patient states they are participating from their home address. Some vital signs may be absent or patient reported.   Patient identification: identified by name, DOB, and current address.    Review of Systems:   Cardiac Risk Factors include: advanced age (>23men, >55 women);dyslipidemia;hypertension     Objective:     Vitals: BP (!) 148/84 Comment: patient reported  Pulse 74 Comment: patient reported  LMP  (LMP Unknown)   There is no height or weight on file to calculate BMI.  Advanced Directives 12/30/2018 02/24/2018 12/26/2017 11/03/2017 09/03/2016  Does Patient Have a Medical Advance Directive? No No No No No  Does patient want to make changes to medical advance directive? - - - No - Patient declined -  Would patient like information on creating a medical advance directive? - - Yes (MAU/Ambulatory/Procedural Areas - Information given) No - Patient declined -    Tobacco Social History   Tobacco Use  Smoking Status Never Smoker  Smokeless Tobacco Never Used     Counseling given: Not Answered   Clinical Intake:                       Past Medical History:  Diagnosis Date  . Arthritis    back  . Dyspnea   . Hyperlipidemia   . Hypertension   . Hypothyroidism   . Thyroid disease    Past Surgical History:  Procedure Laterality Date  . ABDOMINAL HYSTERECTOMY  1978   partial  . COLONOSCOPY WITH PROPOFOL N/A 11/03/2017   Procedure: COLONOSCOPY WITH PROPOFOL;  Surgeon: [name], MD;  Location: [place];  Service: Endoscopy;  Laterality: N/A;   Family History  Problem Relation Age of Onset  . Hyperlipidemia Mother   . Hyperthyroidism Mother    . Cancer Father        lymphoma  . Hypertension Father   . Stroke Sister   . Hypertension Son   . Thyroid disease Son   . Hypertension Sister    Social History   Socioeconomic History  . Marital status: Married    Spouse name: Not on file  . Number of children: Not on file  . Years of education: Not on file  . Highest education level: 11th grade  Occupational History  . Not on file  Social Needs  . Financial resource strain: Not hard at all  . Food insecurity    Worry: Never true    Inability: Never true  . Transportation needs    Medical: No    Non-medical: No  Tobacco Use  . Smoking status: Never Smoker  . Smokeless tobacco: Never Used  Substance and Sexual Activity  . Alcohol use: No    Alcohol/week: 0.0 standard drinks  . Drug use: No  . Sexual activity: Yes  Lifestyle  . Physical activity    Days per week: 0 days    Minutes per session: 0 min  . Stress: Not at all  Relationships  . Social connections    Talks on phone: Once a week    Gets together: More than three times a week    Attends religious service: More than 4  times per year    Active member of club or organization: No    Attends meetings of clubs or organizations: Never    Relationship status: Married  Other Topics Concern  . Not on file  Social History Narrative  . Not on file    Outpatient Encounter Medications as of 12/30/2018  Medication Sig  . atorvastatin (LIPITOR) 20 MG tablet Take 1 tablet (20 mg total) by mouth daily.  [name] levothyroxine (SYNTHROID) 100 MCG tablet TAKE 1 TABLET BY MOUTH ONCE DAILY BEFORE BREAKFAST  . lisinopril-hydrochlorothiazide (ZESTORETIC) 20-12.5 MG tablet Take 2 tablets by mouth once daily  . [DISCONTINUED] cyclobenzaprine (FLEXERIL) 5 MG tablet Take 1 tablet (5 mg total) by mouth 3 (three) times daily as needed for muscle spasms. (Patient not taking: Reported on 12/30/2018)   No facility-administered encounter medications on file as of 12/30/2018.     Activities  of Daily Living In your present state of health, do you have any difficulty performing the following activities: 12/30/2018  Hearing? Y  Vision? N  Difficulty concentrating or making decisions? N  Walking or climbing stairs? N  Dressing or bathing? N  Doing errands, shopping? N  Preparing Food and eating ? N  Using the Toilet? N  In the past six months, have you accidently leaked urine? N  Do you have problems with loss of bowel control? N  Managing your Medications? N  Managing your Finances? N  Housekeeping or managing your Housekeeping? N  Some recent data might be hidden    Patient Care Team: [name], PA-C as PCP - General (Family Medicine)    Assessment:   This is a routine wellness examination for [place].  Exercise Activities and Dietary recommendations Current Exercise Habits: The patient does not participate in regular exercise at present, Exercise limited by: None identified  Goals    . DIET - INCREASE WATER INTAKE     Recommend drinking at least 6-8 glasses of water a day        Fall Risk: Fall Risk  12/30/2018 12/30/2018 12/26/2017 11/18/2017 09/03/2016  Falls in the past year? 0 0 No No No    FALL RISK PREVENTION PERTAINING TO THE HOME:  Any stairs in or around the home? no If so, are there any without handrails? No   Home free of loose throw rugs in walkways, pet beds, electrical cords, etc? Yes  Adequate lighting in your home to reduce risk of falls? Yes   ASSISTIVE DEVICES UTILIZED TO PREVENT FALLS:  Life alert? No  Use of a cane, walker or w/c? No  Grab bars in the bathroom? No  Shower chair or bench in shower? No  Elevated toilet seat or a handicapped toilet? No   DME ORDERS:  DME order needed?  No   TIMED UP AND GO:  Unable to perform    Depression Screen PHQ 2/9 Scores 12/30/2018 12/30/2018 12/26/2017 09/03/2016  PHQ - 2 Score 1 0 1 4  PHQ- 9 Score - - - 4     Cognitive Function     6CIT Screen 12/30/2018 12/26/2017  What  Year? 0 points 0 points  What month? 0 points 0 points  What time? 0 points 0 points  Count back from 20 0 points 0 points  Months in reverse 0 points 0 points  Repeat phrase 0 points 2 points  Total Score 0 2    Immunization History  Administered Date(s) Administered  . Pneumococcal Conjugate-13 09/12/2015  . Pneumococcal Polysaccharide-23 11/18/2017  .  Td 09/19/2008    Qualifies for Shingles Vaccine? Yes  Zostavax completed n/a. Due for Shingrix. Education has been provided regarding the importance of this vaccine. Pt has been advised to call insurance company to determine out of pocket expense. Advised may also receive vaccine at local pharmacy or Health Dept. Verbalized acceptance and understanding.  Tdap: Discussed need for TD/TDAP vaccine, patient verbalized understanding that this is not covered as a preventative with there insurance and to call the office if she develops any new skin injuries, ie: cuts, scrapes, bug bites, or open wounds.  Flu Vaccine: due 03/2019  Pneumococcal Vaccine: up to date    Screening Tests Health Maintenance  Topic Date Due  . MAMMOGRAM  10/05/1998  . TETANUS/TDAP  12/30/2019 (Originally 09/20/2018)  . INFLUENZA VACCINE  02/06/2019  . COLONOSCOPY  11/04/2022  . DEXA SCAN  Completed  . Hepatitis C Screening  Completed  . PNA vac Low Risk Adult  Completed    Cancer Screenings:  Colorectal Screening: Completed 11/03/2017 Repeat every 5 years  Mammogram: declined, checking with insurance for coverage   Bone Density: Completed 08/15/2005.   Lung Cancer Screening: (Low Dose CT Chest recommended if Age 20-80 years, 30 pack-year currently smoking OR have quit w/in 15years.) does not qualify.   Additional Screening:  Hepatitis C Screening: does qualify; Completed 11/18/2017  Vision Screening: Recommended annual ophthalmology exams for early detection of glaucoma and other disorders of the eye. Is the patient up to date with their annual eye  exam?  Yes  Who is the provider or what is the name of the office in which the pt attends annual eye exams? [facility]    Dental Screening: Recommended annual dental exams for proper oral hygiene  Community Resource Referral:  CRR required this visit?  No       Plan:  I have personally reviewed and addressed the Medicare Annual Wellness questionnaire and have noted the following in the patient's chart:  A. Medical and social history B. Use of alcohol, tobacco or illicit drugs  C. Current medications and supplements D. Functional ability and status E.  Nutritional status F.  Physical activity G. Advance directives H. List of other physicians I.  Hospitalizations, surgeries, and ER visits in previous 12 months J.  [place] such as hearing and vision if needed, cognitive and depression L. Referrals and appointments   In addition, I have reviewed and discussed with patient certain preventive protocols, quality metrics, and best practice recommendations. A written personalized care plan for preventive services as well as general preventive health recommendations were provided to patient. Nurse Health Advisor  Signed,    [place], Caro Hight, [place]  11/01/8339 Nurse Health Advisor   Nurse Notes: none

## 2019-01-04 ENCOUNTER — Telehealth: Payer: Self-pay | Admitting: Family Medicine

## 2019-01-04 NOTE — Telephone Encounter (Signed)
Unfortunately, we are unable to provide a note for this.

## 2019-01-04 NOTE — Telephone Encounter (Signed)
Called, no answer. LVM for patient to return phone call.

## 2019-01-04 NOTE — Telephone Encounter (Signed)
Called patient and explained due to safety precautions and guidelines Alyssa Bryant is unable to provide a letter stating she can't wear a mask.  Patient stated other doctors write letters she didn't understand why Alyssa Bryant couldn't due to everything she has. I tried to explain safety precautions again and patient stated that the mask would hurt her more than protect her and she wasn't wearing one.  Patient stated that she's not wearing one and she'll take a leave if she has too.  I apologized to patient and told her if she needed anything else to call us and I hope she had a great day and patient hung up the phone.  Routing to provider as Juluis Rainier.

## 2019-01-04 NOTE — Telephone Encounter (Signed)
Pt called and state that she can not wear a mask because she feels like she cant breath. Pt would like a note that exempts her from wearing a mask at work. Please advise

## 2019-01-06 DIAGNOSIS — B078 Other viral warts: Secondary | ICD-10-CM | POA: Diagnosis not present

## 2019-01-06 DIAGNOSIS — B353 Tinea pedis: Secondary | ICD-10-CM | POA: Diagnosis not present

## 2019-01-18 ENCOUNTER — Other Ambulatory Visit: Payer: Self-pay | Admitting: Unknown Physician Specialty

## 2019-01-27 DIAGNOSIS — D485 Neoplasm of uncertain behavior of skin: Secondary | ICD-10-CM | POA: Diagnosis not present

## 2019-01-27 DIAGNOSIS — B078 Other viral warts: Secondary | ICD-10-CM | POA: Diagnosis not present

## 2019-01-27 DIAGNOSIS — C44519 Basal cell carcinoma of skin of other part of trunk: Secondary | ICD-10-CM | POA: Diagnosis not present

## 2019-02-24 DIAGNOSIS — C44519 Basal cell carcinoma of skin of other part of trunk: Secondary | ICD-10-CM | POA: Diagnosis not present

## 2019-02-24 DIAGNOSIS — C4491 Basal cell carcinoma of skin, unspecified: Secondary | ICD-10-CM | POA: Diagnosis not present

## 2019-03-10 DIAGNOSIS — B078 Other viral warts: Secondary | ICD-10-CM | POA: Diagnosis not present

## 2019-03-10 DIAGNOSIS — C44612 Basal cell carcinoma of skin of right upper limb, including shoulder: Secondary | ICD-10-CM | POA: Diagnosis not present

## 2019-03-10 DIAGNOSIS — D485 Neoplasm of uncertain behavior of skin: Secondary | ICD-10-CM | POA: Diagnosis not present

## 2019-03-15 ENCOUNTER — Other Ambulatory Visit: Payer: Self-pay | Admitting: Unknown Physician Specialty

## 2019-03-16 NOTE — Telephone Encounter (Signed)
Requested medication (s) are due for refill today: yes  Requested medication (s) are on the active medication list: yes  Last refill:  12/15/2018  Future visit scheduled: no  Notes to clinic:  Review for refill pcp and ordering provider are different   Requested Prescriptions  Pending Prescriptions Disp Refills   EUTHYROX 100 MCG tablet [Pharmacy Med Name: Euthyrox 100 MCG Oral Tablet] 90 tablet 0    Sig: TAKE 1 TABLET BY MOUTH ONCE DAILY BEFORE BREAKFAST. MUST KEEP UPCOMING APPOINTMENT.     Endocrinology:  Hypothyroid Agents Failed - 03/15/2019 10:15 AM      Failed - TSH needs to be rechecked within 3 months after an abnormal result. Refill until TSH is due.      Failed - TSH in normal range and within 360 days    TSH  Date Value Ref Range Status  11/18/2017 2.560 0.450 - 4.500 uIU/mL Final         Passed - Valid encounter within last 12 months    Recent Outpatient Visits          3 months ago Essential hypertension   [place], [place], [place]   4 months ago Acute right-sided low back pain without sciatica   [facility] [name], [place]   6 months ago Acute suppurative otitis media of left ear with spontaneous rupture of tympanic membrane, recurrence not specified   [facility] [name], [place]   6 months ago Upper respiratory tract infection, unspecified type   [place], [place], [place]   12 months ago Rectal bleeding   [place], [place], [place]

## 2019-03-24 DIAGNOSIS — C44612 Basal cell carcinoma of skin of right upper limb, including shoulder: Secondary | ICD-10-CM | POA: Diagnosis not present

## 2019-03-24 DIAGNOSIS — C4491 Basal cell carcinoma of skin, unspecified: Secondary | ICD-10-CM | POA: Diagnosis not present

## 2019-05-07 ENCOUNTER — Other Ambulatory Visit: Payer: Self-pay

## 2019-05-07 NOTE — Patient Outreach (Signed)
[facility]) Care Management  05/07/2019  Alyssa Bryant 1952-04-70 [id number]   Medication Adherence call to Alyssa Bryant HIPPA Compliant Voice message left with a call back number. Alyssa Bryant is showing past due on Atorvastatin 20 mg under [facility].   [facility] Management Direct Dial [phone]  Fax [phone] [email or web address]

## 2019-05-17 ENCOUNTER — Other Ambulatory Visit: Payer: Self-pay

## 2019-05-17 NOTE — Patient Outreach (Signed)
[facility]) Care Management  05/17/2019  Stela Dunsford [birth year] [id number]   Medication Adherence call to Mrs. Talise Sanders HIPPA Compliant Voice message left with a call back number. Mrs. Chambliss is showing past due on Atorvastatin 20 mg under [facility] ins.   [place] [facility] Management Direct Dial [phone]  Fax [phone] [email or web address]

## 2019-05-26 ENCOUNTER — Other Ambulatory Visit: Payer: Self-pay | Admitting: Unknown Physician Specialty

## 2019-06-04 ENCOUNTER — Other Ambulatory Visit: Payer: Self-pay | Admitting: Family Medicine

## 2019-06-04 NOTE — Telephone Encounter (Signed)
Requested medication (s) are due for refill today: yes  Requested medication (s) are on the active medication list: yes  Last refill: 03/02/2019  Future visit scheduled: no  Notes to clinic:  Due for CPE Review for refill   Requested Prescriptions  Pending Prescriptions Disp Refills   lisinopril-hydrochlorothiazide (ZESTORETIC) 20-12.5 MG tablet [Pharmacy Med Name: Lisinopril-hydroCHLOROthiazide 20-12.5 MG Oral Tablet] 180 tablet 0    Sig: Take 2 tablets by mouth once daily     Cardiovascular:  ACEI + Diuretic Combos Failed - 06/04/2019  9:02 AM      Failed - Na in normal range and within 180 days    Sodium  Date Value Ref Range Status  11/18/2017 139 134 - 144 mmol/L Final         Failed - K in normal range and within 180 days    Potassium  Date Value Ref Range Status  11/18/2017 4.4 3.5 - 5.2 mmol/L Final         Failed - Cr in normal range and within 180 days    Creatinine, Ser  Date Value Ref Range Status  11/18/2017 0.99 0.57 - 1.00 mg/dL Final         Failed - Ca in normal range and within 180 days    Calcium  Date Value Ref Range Status  11/18/2017 9.2 8.7 - 10.3 mg/dL Final         Failed - Last BP in normal range    BP Readings from Last 1 Encounters:  12/30/18 (!) 148/84         Passed - Patient is not pregnant      Passed - Valid encounter within last 6 months    Recent Outpatient Visits          6 months ago Essential hypertension   [place], [place], [place]   7 months ago Acute right-sided low back pain without sciatica   [facility] [name], PA-C   8 months ago Acute suppurative otitis media of left ear with spontaneous rupture of tympanic membrane, recurrence not specified   [facility] [name], PA-C   8 months ago Upper respiratory tract infection, unspecified type   [facility], [place], [place]   1 year ago Rectal bleeding   [facility] Merrie Roof [place], PA-C              EUTHYROX 100 MCG tablet [Pharmacy Med Name: Euthyrox 100 MCG Oral Tablet] 90 tablet 0    Sig: TAKE 1 TABLET BY MOUTH ONCE DAILY BEFORE BREAKFAST. MUST KEEP UPCOMING APPOINTMENT.     Endocrinology:  Hypothyroid Agents Failed - 06/04/2019  9:02 AM      Failed - TSH needs to be rechecked within 3 months after an abnormal result. Refill until TSH is due.      Failed - TSH in normal range and within 360 days    TSH  Date Value Ref Range Status  11/18/2017 2.560 0.450 - 4.500 uIU/mL Final         Passed - Valid encounter within last 12 months    Recent Outpatient Visits          6 months ago Essential hypertension   [place], [place], [place]   7 months ago Acute right-sided low back pain without sciatica   [place], [place]   8 months ago Acute suppurative otitis media of left  ear with spontaneous rupture of tympanic membrane, recurrence not specified   [place], [place], [place]   8 months ago Upper respiratory tract infection, unspecified type   [place], [place], [place]   1 year ago Rectal bleeding   [facility] Merrie Roof [place], [place]

## 2019-06-07 ENCOUNTER — Other Ambulatory Visit: Payer: Self-pay

## 2019-06-07 MED ORDER — LISINOPRIL-HYDROCHLOROTHIAZIDE 20-12.5 MG PO TABS: 2.0000 | ORAL_TABLET | Freq: Every day | ORAL | 0 refills | Status: DC

## 2019-06-07 NOTE — Telephone Encounter (Signed)
Due for CPE/6 month f/u. Will renew x 1 month to bridge

## 2019-06-07 NOTE — Telephone Encounter (Signed)
Routing to provider  I do not see an appt scheduled for this patient.

## 2019-06-08 ENCOUNTER — Encounter: Payer: Self-pay | Admitting: Family Medicine

## 2019-06-08 NOTE — Telephone Encounter (Signed)
Called to schedule appt, pts phone was breaking up but will call back.

## 2019-06-08 NOTE — Telephone Encounter (Signed)
Letter printed to mail to pt.

## 2019-06-14 ENCOUNTER — Other Ambulatory Visit: Payer: Self-pay

## 2019-06-14 ENCOUNTER — Encounter: Payer: Self-pay | Admitting: Family Medicine

## 2019-06-14 ENCOUNTER — Ambulatory Visit (INDEPENDENT_AMBULATORY_CARE_PROVIDER_SITE_OTHER): Payer: Medicare Other | Admitting: Family Medicine

## 2019-06-14 VITALS — BP 112/62 | HR 78 | Temp 98.1°F | Ht 62.0 in | Wt 197.0 lb

## 2019-06-14 DIAGNOSIS — E782 Mixed hyperlipidemia: Secondary | ICD-10-CM

## 2019-06-14 DIAGNOSIS — I10 Essential (primary) hypertension: Secondary | ICD-10-CM | POA: Diagnosis not present

## 2019-06-14 DIAGNOSIS — E038 Other specified hypothyroidism: Secondary | ICD-10-CM | POA: Diagnosis not present

## 2019-06-14 MED ORDER — LEVOTHYROXINE SODIUM 100 MCG PO TABS: 100.0000 ug | ORAL_TABLET | Freq: Every day | ORAL | 3 refills | Status: DC

## 2019-06-14 MED ORDER — LISINOPRIL-HYDROCHLOROTHIAZIDE 20-12.5 MG PO TABS: 2.0000 | ORAL_TABLET | Freq: Every day | ORAL | 1 refills | Status: DC

## 2019-06-14 NOTE — Progress Notes (Signed)
BP 112/62   Pulse 78   Temp 98.1 F (36.7 C) (Oral)   Ht 5\' 2"  (1.575 m)   Wt 197 lb (89.4 kg)   LMP  (LMP Unknown)   SpO2 96%   BMI 36.03 kg/m    Subjective:    Patient ID: Alyssa Bryant, female    DOB: April 07, 1951, 68 y.o.   MRN: [id number]  HPI: Alyssa Bryant is a 68 y.o. female  Chief Complaint  Patient presents with  . Hypothyroidism  . Hypertension  . Hyperlipidemia   Patient presenting today for 6 month f/u chronic conditions.   Does not check home BPs. Tolerating medicines well without side effects. Denies CP, SOB, HAs, dizziness. Has some increased stressors currently due to health issues with her husband.   HLD - Taking lipitor, denies claudication, myalgias. Eats very healthy and stays active in the home.   Hypothyroidism - Taking her medicine faithfully, asymptomatic.   No new concerns today.   Relevant past medical, surgical, family and social history reviewed and updated as indicated. Interim medical history since our last visit reviewed. Allergies and medications reviewed and updated.  Review of Systems  Per HPI unless specifically indicated above     Objective:    BP 112/62   Pulse 78   Temp 98.1 F (36.7 C) (Oral)   Ht 5\' 2"  (1.575 m)   Wt 197 lb (89.4 kg)   LMP  (LMP Unknown)   SpO2 96%   BMI 36.03 kg/m   Wt Readings from Last 3 Encounters:  06/14/19 197 lb (89.4 kg)  10/22/18 198 lb (89.8 kg)  09/16/18 197 lb (89.4 kg)    Physical Exam Vitals signs and nursing note reviewed.  Constitutional:      Appearance: Normal appearance. She is not ill-appearing.  HENT:     Head: Atraumatic.  Eyes:     Extraocular Movements: Extraocular movements intact.     Conjunctiva/sclera: Conjunctivae normal.  Neck:     Musculoskeletal: Normal range of motion and neck supple.  Cardiovascular:     Rate and Rhythm: Normal rate and regular rhythm.     Heart sounds: Normal heart sounds.  Pulmonary:     Effort: Pulmonary effort is normal.     Breath  sounds: Normal breath sounds.  Musculoskeletal: Normal range of motion.  Skin:    General: Skin is warm and dry.  Neurological:     Mental Status: She is alert and oriented to person, place, and time.  Psychiatric:        Mood and Affect: Mood normal.        Thought Content: Thought content normal.        Judgment: Judgment normal.     Results for orders placed or performed in visit on 03/18/18  CBC with Differential/Platelet  Result Value Ref Range   WBC 7.0 3.4 - 10.8 x10E3/uL   RBC 4.06 3.77 - 5.28 x10E6/uL   Hemoglobin 12.4 11.1 - 15.9 g/dL   Hematocrit 37.4 34.0 - 46.6 %   MCV 92 79 - 97 fL   MCH 30.5 26.6 - 33.0 pg   MCHC 33.2 31.5 - 35.7 g/dL   RDW 14.0 12.3 - 15.4 %   Platelets 340 150 - 450 x10E3/uL   Neutrophils 73 Not Estab. %   Lymphs 18 Not Estab. %   Monocytes 7 Not Estab. %   Eos 0 Not Estab. %   Basos 1 Not Estab. %   Neutrophils Absolute 5.2 1.4 -  7.0 x10E3/uL   Lymphocytes Absolute 1.2 0.7 - 3.1 x10E3/uL   Monocytes Absolute 0.5 0.1 - 0.9 x10E3/uL   EOS (ABSOLUTE) 0.0 0.0 - 0.4 x10E3/uL   Basophils Absolute 0.0 0.0 - 0.2 x10E3/uL   Immature Granulocytes 1 Not Estab. %   Immature Grans (Abs) 0.1 0.0 - 0.1 x10E3/uL      Assessment & Plan:   Problem List Items Addressed This Visit      Cardiovascular and Mediastinum   Hypertension - Primary    BPs stable and WNL, continue current regimen      Relevant Medications   ASPIRIN 81 PO   lisinopril-hydrochlorothiazide (ZESTORETIC) 20-12.5 MG tablet   Other Relevant Orders   Comprehensive metabolic panel     Endocrine   Hypothyroidism    Recheck TSH, adjust dose if needd. Continue current regimen      Relevant Medications   levothyroxine (SYNTHROID) 100 MCG tablet   Other Relevant Orders   TSH     Other   Mixed hyperlipidemia    Recheck lipids, adjust as needed. Continue current regimen      Relevant Medications   ASPIRIN 81 PO   lisinopril-hydrochlorothiazide (ZESTORETIC) 20-12.5 MG  tablet   Other Relevant Orders   Lipid Panel w/o Chol/HDL Ratio out       Follow up plan: Return in about 6 months (around 12/13/2019) for CPE.

## 2019-06-14 NOTE — Assessment & Plan Note (Signed)
BPs stable and WNL, continue current regimen 

## 2019-06-14 NOTE — Assessment & Plan Note (Signed)
Recheck TSH, adjust dose if needd. Continue current regimen

## 2019-06-14 NOTE — Assessment & Plan Note (Signed)
Recheck lipids, adjust as needed. Continue current regimen 

## 2019-06-15 ENCOUNTER — Telehealth: Payer: Self-pay | Admitting: Family Medicine

## 2019-06-15 LAB — COMPREHENSIVE METABOLIC PANEL
ALT: 14 IU/L (ref 0–32)
AST: 19 IU/L (ref 0–40)
Albumin/Globulin Ratio: 1.6 (ref 1.2–2.2)
Albumin: 4.2 g/dL (ref 3.8–4.8)
Alkaline Phosphatase: 117 IU/L (ref 39–117)
BUN/Creatinine Ratio: 24 (ref 12–28)
BUN: 25 mg/dL (ref 8–27)
Bilirubin Total: 0.2 mg/dL (ref 0.0–1.2)
CO2: 22 mmol/L (ref 20–29)
Calcium: 9.4 mg/dL (ref 8.7–10.3)
Chloride: 105 mmol/L (ref 96–106)
Creatinine, Ser: 1.04 mg/dL — ABNORMAL HIGH (ref 0.57–1.00)
GFR calc Af Amer: 63 mL/min/{1.73_m2} (ref >59)
GFR calc non Af Amer: 55 mL/min/{1.73_m2} — ABNORMAL LOW (ref >59)
Globulin, Total: 2.6 g/dL (ref 1.5–4.5)
Glucose: 88 mg/dL (ref 65–99)
Potassium: 4.2 mmol/L (ref 3.5–5.2)
Sodium: 142 mmol/L (ref 134–144)
Total Protein: 6.8 g/dL (ref 6.0–8.5)

## 2019-06-15 LAB — LIPID PANEL W/O CHOL/HDL RATIO
Cholesterol, Total: 138 mg/dL (ref 100–199)
HDL: 36 mg/dL — ABNORMAL LOW (ref >39)
LDL Chol Calc (NIH): 71 mg/dL (ref 0–99)
Triglycerides: 184 mg/dL — ABNORMAL HIGH (ref 0–149)
VLDL Cholesterol Cal: 31 mg/dL (ref 5–40)

## 2019-06-15 LAB — TSH: TSH: 1.11 u[IU]/mL (ref 0.450–4.500)

## 2019-06-15 NOTE — Telephone Encounter (Signed)
Pt called with additional questions regarding lab results from call today.  Answered to pt satisfaction.  Advised to CB if any other questions arise.

## 2019-08-12 ENCOUNTER — Ambulatory Visit: Payer: Self-pay

## 2019-08-12 ENCOUNTER — Ambulatory Visit (INDEPENDENT_AMBULATORY_CARE_PROVIDER_SITE_OTHER): Payer: Medicare Other | Admitting: Family Medicine

## 2019-08-12 ENCOUNTER — Encounter: Payer: Self-pay | Admitting: Family Medicine

## 2019-08-12 VITALS — BP 145/74 | HR 66 | Temp 97.9°F

## 2019-08-12 DIAGNOSIS — R9431 Abnormal electrocardiogram [ECG] [EKG]: Secondary | ICD-10-CM | POA: Diagnosis not present

## 2019-08-12 DIAGNOSIS — R42 Dizziness and giddiness: Secondary | ICD-10-CM

## 2019-08-12 DIAGNOSIS — E559 Vitamin D deficiency, unspecified: Secondary | ICD-10-CM | POA: Diagnosis not present

## 2019-08-12 DIAGNOSIS — E039 Hypothyroidism, unspecified: Secondary | ICD-10-CM | POA: Diagnosis not present

## 2019-08-12 NOTE — Assessment & Plan Note (Addendum)
Of unclear etiology complicated by chronic tinnitus. With room spinning, concern for vertigo, however her provoking factors do not sound like vertigo. Will have her come in for blood work, EKG and orthostatic vital signs today. Await those results.   Orthostatics normal in office. EKG shows flipped t-waves in V1-3. No CP. Still only dizzy. Will get her into cardiology and await labs. If cardiology work up normal, consider MRI of brain given dizziness and tinnitus (although it's chronic). Continue to monitor closely.

## 2019-08-12 NOTE — Telephone Encounter (Signed)
Pt. Reports she started having dizziness Monday. Mainly "when I bend over." Goes away with rest. Husband checked her BP 2 days ago - 100/50. Reports she is eating and drinking well. No availability with her PCP. Appointment made with another provider.  Reason for Disposition . [1] MODERATE dizziness (e.g., interferes with normal activities) AND [2] has NOT been evaluated by physician for this  (Exception: dizziness caused by heat exposure, sudden standing, or poor fluid intake)  Answer Assessment - Initial Assessment Questions 1. DESCRIPTION: "Describe your dizziness."     Lightheaded 2. LIGHTHEADED: "Do you feel lightheaded?" (e.g., somewhat faint, woozy, weak upon standing)     Bending over 3. VERTIGO: "Do you feel like either you or the room is spinning or tilting?" (i.e. vertigo)     No 4. SEVERITY: "How bad is it?"  "Do you feel like you are going to faint?" "Can you stand and walk?"   - MILD - walking normally   - MODERATE - interferes with normal activities (e.g., work, school)    - SEVERE - unable to stand, requires support to walk, feels like passing out now.      Moderate 5. ONSET:  "When did the dizziness begin?"     Monday 6. AGGRAVATING FACTORS: "Does anything make it worse?" (e.g., standing, change in head position)     Bending over 7. HEART RATE: "Can you tell me your heart rate?" "How many beats in 15 seconds?"  (Note: not all patients can do this)       No 8. CAUSE: "What do you think is causing the dizziness?"     Unsure 9. RECURRENT SYMPTOM: "Have you had dizziness before?" If so, ask: "When was the last time?" "What happened that time?"     No 10. OTHER SYMPTOMS: "Do you have any other symptoms?" (e.g., fever, chest pain, vomiting, diarrhea, bleeding)       Had nausea Tuesday 11. PREGNANCY: "Is there any chance you are pregnant?" "When was your last menstrual period?"       No  Protocols used: DIZZINESS [facility]

## 2019-08-12 NOTE — Progress Notes (Signed)
Examined pt and reviewed EKG, orthostatics as provider working virtually today. Orthostatic VSs benign, patient well appearing and in no distress. EKG showing some flipped T waves in V1-3 and no EKG to compare to previously. Discussed results with patient, who denies any current CP, SOB, chest pressure. Pt prefers urgent Cardiology referral vs ER visit, but discussed at length ER precautions if becoming symptomatic prior to Cardiology consultation.

## 2019-08-12 NOTE — Progress Notes (Signed)
BP (!) 145/74   Pulse 66   Temp 97.9 F (36.6 C)   LMP  (LMP Unknown)   SpO2 95%    Subjective:    Patient ID: Alyssa Bryant, female    DOB: 1950/12/17, 69 y.o.   MRN: [id number]  HPI: Alyssa Bryant is a 69 y.o. female  Chief Complaint  Patient presents with  . Dizziness   DIZZINESS- started when she was laying down on Monday Duration: 4 days off and on Description of symptoms: room spinning Duration of episode: minutes Dizziness frequency: recurrent Provoking factors: bending over Aggravating factors:  Bending over Triggered by rolling over in bed: no Triggered by bending over: yes Aggravated by head movement: no Aggravated by exertion, coughing, loud noises: no Recent head injury: no Recent or current viral symptoms: no History of vasovagal episodes: no Nausea: yes Vomiting: no Tinnitus: yes - chronic Hearing loss: no Aural fullness: no Headache: yes Photophobia/phonophobia: no Unsteady gait: no Postural instability: no Diplopia, dysarthria, dysphagia or weakness: no Related to exertion: no Pallor: no Diaphoresis: no Dyspnea: no Chest pain: no  Relevant past medical, surgical, family and social history reviewed and updated as indicated. Interim medical history since our last visit reviewed. Allergies and medications reviewed and updated.  Review of Systems  Constitutional: Negative.   HENT: Negative.   Respiratory: Negative.   Cardiovascular: Negative.   Gastrointestinal: Positive for nausea. Negative for abdominal distention, abdominal pain, anal bleeding, blood in stool, constipation, diarrhea, rectal pain and vomiting.  Genitourinary: Negative.   Musculoskeletal: Negative.   Neurological: Positive for dizziness. Negative for tremors, seizures, syncope, facial asymmetry, speech difficulty, weakness, light-headedness, numbness and headaches.  Psychiatric/Behavioral: Negative.     Per HPI unless specifically indicated above     Objective:    BP (!)  145/74   Pulse 66   Temp 97.9 F (36.6 C)   LMP  (LMP Unknown)   SpO2 95%   Wt Readings from Last 3 Encounters:  06/14/19 197 lb (89.4 kg)  10/22/18 198 lb (89.8 kg)  09/16/18 197 lb (89.4 kg)   Orthostatic VS for the past 24 hrs:  BP- Lying Pulse- Lying BP- Sitting Pulse- Sitting BP- Standing at 0 minutes Pulse- Standing at 0 minutes  08/12/19 1332 153/80 68 148/81 70 147/84 78    Physical Exam Vitals and nursing note reviewed.  Pulmonary:     Effort: Pulmonary effort is normal. No respiratory distress.     Comments: Speaking in full sentences Neurological:     Mental Status: She is alert.  Psychiatric:        Mood and Affect: Mood normal.        Behavior: Behavior normal.        Thought Content: Thought content normal.        Judgment: Judgment normal.     Results for orders placed or performed in visit on 06/14/19  Comprehensive metabolic panel  Result Value Ref Range   Glucose 88 65 - 99 mg/dL   BUN 25 8 - 27 mg/dL   Creatinine, Ser 1.04 (H) 0.57 - 1.00 mg/dL   GFR calc non Af Amer 55 (L) >59 mL/min/1.73   GFR calc Af Amer 63 >59 mL/min/1.73   BUN/Creatinine Ratio 24 12 - 28   Sodium 142 134 - 144 mmol/L   Potassium 4.2 3.5 - 5.2 mmol/L   Chloride 105 96 - 106 mmol/L   CO2 22 20 - 29 mmol/L   Calcium 9.4 8.7 - 10.3 mg/dL  Total Protein 6.8 6.0 - 8.5 g/dL   Albumin 4.2 3.8 - 4.8 g/dL   Globulin, Total 2.6 1.5 - 4.5 g/dL   Albumin/Globulin Ratio 1.6 1.2 - 2.2   Bilirubin Total 0.2 0.0 - 1.2 mg/dL   Alkaline Phosphatase 117 39 - 117 IU/L   AST 19 0 - 40 IU/L   ALT 14 0 - 32 IU/L  Lipid Panel w/o Chol/HDL Ratio out  Result Value Ref Range   Cholesterol, Total 138 100 - 199 mg/dL   Triglycerides 184 (H) 0 - 149 mg/dL   HDL 36 (L) >39 mg/dL   VLDL Cholesterol Cal 31 5 - 40 mg/dL   LDL Chol Calc (NIH) 71 0 - 99 mg/dL  TSH  Result Value Ref Range   TSH 1.110 0.450 - 4.500 uIU/mL      Assessment & Plan:   Problem List Items Addressed This Visit       Other   Dizziness - Primary    Of unclear etiology complicated by chronic tinnitus. With room spinning, concern for vertigo, however her provoking factors do not sound like vertigo. Will have her come in for blood work, EKG and orthostatic vital signs today. Await those results.   Orthostatics normal in office. EKG shows flipped t-waves in V1-3. No CP. Still only dizzy. Will get her into cardiology and await labs. If cardiology work up normal, consider MRI of brain given dizziness and tinnitus (although it's chronic). Continue to monitor closely.      Relevant Orders   EKG 12-Lead (Completed)   CBC with Differential/Platelet   Comprehensive metabolic panel   TSH   VITAMIN D 25 Hydroxy (Vit-D Deficiency, Fractures)   Ambulatory referral to Cardiology    Other Visit Diagnoses    Abnormal EKG       Sudden onset dizziness on Monday. No CP, no SOB. Some nausea and dizziness. Urgent referral to cardiology. Call with any concerns.    Relevant Orders   Ambulatory referral to Cardiology       Follow up plan: Return Pending results.    . This visit was completed via telephone due to the restrictions of the COVID-19 pandemic. All issues as above were discussed and addressed but no physical exam was performed. If it was felt that the patient should be evaluated in the office, they were directed there. The patient verbally consented to this visit. Patient was unable to complete an audio/visual visit due to Lack of equipment. Due to the catastrophic nature of the COVID-19 pandemic, this visit was done through audio contact only. . Location of the patient: home . Location of the provider: home . Those involved with this call:  . Provider: Park Liter, DO . CMA: Tiffany Reel, CMA . Front Desk/Registration: Don Perking  . Time spent on call: 15 minutes on the phone discussing health concerns. 23 minutes total spent in review of patient's record and preparation of their chart.

## 2019-08-13 ENCOUNTER — Encounter: Payer: Self-pay | Admitting: Cardiology

## 2019-08-13 ENCOUNTER — Ambulatory Visit (INDEPENDENT_AMBULATORY_CARE_PROVIDER_SITE_OTHER): Payer: Medicare Other | Admitting: Cardiology

## 2019-08-13 ENCOUNTER — Other Ambulatory Visit: Payer: Self-pay

## 2019-08-13 VITALS — BP 124/80 | HR 78 | Ht 62.0 in | Wt 198.0 lb

## 2019-08-13 DIAGNOSIS — R42 Dizziness and giddiness: Secondary | ICD-10-CM | POA: Diagnosis not present

## 2019-08-13 DIAGNOSIS — R9431 Abnormal electrocardiogram [ECG] [EKG]: Secondary | ICD-10-CM | POA: Diagnosis not present

## 2019-08-13 DIAGNOSIS — E7800 Pure hypercholesterolemia, unspecified: Secondary | ICD-10-CM | POA: Diagnosis not present

## 2019-08-13 DIAGNOSIS — I10 Essential (primary) hypertension: Secondary | ICD-10-CM

## 2019-08-13 LAB — COMPREHENSIVE METABOLIC PANEL
ALT: 13 IU/L (ref 0–32)
AST: 18 IU/L (ref 0–40)
Albumin/Globulin Ratio: 1.8 (ref 1.2–2.2)
Albumin: 4.5 g/dL (ref 3.8–4.8)
Alkaline Phosphatase: 115 IU/L (ref 39–117)
BUN/Creatinine Ratio: 24 (ref 12–28)
BUN: 29 mg/dL — ABNORMAL HIGH (ref 8–27)
Bilirubin Total: 0.2 mg/dL (ref 0.0–1.2)
CO2: 22 mmol/L (ref 20–29)
Calcium: 9.3 mg/dL (ref 8.7–10.3)
Chloride: 104 mmol/L (ref 96–106)
Creatinine, Ser: 1.19 mg/dL — ABNORMAL HIGH (ref 0.57–1.00)
GFR calc Af Amer: 53 mL/min/{1.73_m2} — ABNORMAL LOW (ref >59)
GFR calc non Af Amer: 46 mL/min/{1.73_m2} — ABNORMAL LOW (ref >59)
Globulin, Total: 2.5 g/dL (ref 1.5–4.5)
Glucose: 100 mg/dL — ABNORMAL HIGH (ref 65–99)
Potassium: 4.2 mmol/L (ref 3.5–5.2)
Sodium: 139 mmol/L (ref 134–144)
Total Protein: 7 g/dL (ref 6.0–8.5)

## 2019-08-13 LAB — CBC WITH DIFFERENTIAL/PLATELET
Basophils Absolute: 0 10*3/uL (ref 0.0–0.2)
Basos: 1 %
EOS (ABSOLUTE): 0 10*3/uL (ref 0.0–0.4)
Eos: 0 %
Hematocrit: 39.9 % (ref 34.0–46.6)
Hemoglobin: 13.6 g/dL (ref 11.1–15.9)
Immature Grans (Abs): 0.1 10*3/uL (ref 0.0–0.1)
Immature Granulocytes: 1 %
Lymphocytes Absolute: 1.3 10*3/uL (ref 0.7–3.1)
Lymphs: 18 %
MCH: 30.2 pg (ref 26.6–33.0)
MCHC: 34.1 g/dL (ref 31.5–35.7)
MCV: 89 fL (ref 79–97)
Monocytes Absolute: 0.4 10*3/uL (ref 0.1–0.9)
Monocytes: 6 %
Neutrophils Absolute: 5.5 10*3/uL (ref 1.4–7.0)
Neutrophils: 74 %
Platelets: 320 10*3/uL (ref 150–450)
RBC: 4.5 x10E6/uL (ref 3.77–5.28)
RDW: 12.7 % (ref 11.7–15.4)
WBC: 7.4 10*3/uL (ref 3.4–10.8)

## 2019-08-13 LAB — VITAMIN D 25 HYDROXY (VIT D DEFICIENCY, FRACTURES): Vit D, 25-Hydroxy: 12.6 ng/mL — ABNORMAL LOW (ref 30.0–100.0)

## 2019-08-13 LAB — TSH: TSH: 1.21 u[IU]/mL (ref 0.450–4.500)

## 2019-08-13 NOTE — Patient Instructions (Addendum)
Medication Instructions:   1. Your physician recommends that you continue on your current medications as directed. Please refer to the Current Medication list given to you today.  *If you need a refill on your cardiac medications before your next appointment, please call your pharmacy*  Lab Work:  1. None Ordered   If you have labs (blood work) drawn today and your tests are completely normal, you will receive your results only by: [name] MyChart Message (if you have MyChart) OR . A paper copy in the mail If you have any lab test that is abnormal or we need to change your treatment, we will call you to review the results.  Testing/Procedures:  1. None ordered  Follow-Up: At [facility], you and your health needs are our priority.  As part of our continuing mission to provide you with exceptional heart care, we have created designated Provider Care Teams.  These Care Teams include your primary Cardiologist (physician) and Advanced Practice Providers (APPs -  Physician Assistants and Nurse Practitioners) who all work together to provide you with the care you need, when you need it.  Your next appointment:   Follow up as needed  The format for your next appointment:   Either In Person or Virtual  Provider:    You may see [name], MD or one of the following Advanced Practice Providers on your designated Care Team:    Murray Hodgkins, NP  Christell Faith, PA-C  Marrianne Mood, PA-C

## 2019-08-13 NOTE — Progress Notes (Signed)
Cardiology Office Note:    Date:  08/13/2019   ID:  Alyssa Bryant, DOB 1950/07/09, MRN [id number]  PCP:  [name], PA-C  Cardiologist:  [name], MD  Electrophysiologist:  None   Referring MD: [name],*   Chief Complaint  Patient presents with  . New Patient (Initial Visit)    Dizziness and abnormal EKG; Meds verbally reviewed with patient.    History of Present Illness:    Alyssa Bryant is a 69 y.o. female with a hx of hypertension, hyperlipidemia who presented due to dizziness and abnormal ECG.  Symptoms of dizziness started 5 days ago.  Patient was sitting in bed and then suddenly felt like the room was spinning.  Symptoms lasted about 2 to 3 minutes.  She felt fine the next 2 days and then yesterday while bending to putting the stopper in her tub, she felt dizzy on getting up lasting roughly 3 minutes.  She called her PCP who advised her to come to the office.  Orthostatics did not show any evidence for orthostasis but ECG obtained yesterday reviewed by myself showed normal sinus rhythm with T wave inversions in V1 to V3.  Patient otherwise states feeling fine, denies chest pain or shortness of breath at rest or with exertion.  Denies any history of heart disease.  Past Medical History:  Diagnosis Date  . Arthritis    back  . Dyspnea   . Hyperlipidemia   . Hypertension   . Hypothyroidism   . Thyroid disease     Past Surgical History:  Procedure Laterality Date  . ABDOMINAL HYSTERECTOMY  1978   partial  . COLONOSCOPY WITH PROPOFOL N/A 11/03/2017   Procedure: COLONOSCOPY WITH PROPOFOL;  Surgeon: [name], MD;  Location: [facility];  Service: Endoscopy;  Laterality: N/A;    Current Medications: Current Meds  Medication Sig  . ASPIRIN 81 PO Take by mouth daily.  [name] atorvastatin (LIPITOR) 20 MG tablet Take 1 tablet by mouth once daily  . levothyroxine (SYNTHROID) 100 MCG tablet Take 1 tablet (100 mcg total) by mouth daily.  [name]  lisinopril-hydrochlorothiazide (ZESTORETIC) 20-12.5 MG tablet Take 2 tablets by mouth daily.     Allergies:   Sulfa antibiotics   Social History   Socioeconomic History  . Marital status: Married    Spouse name: Not on file  . Number of children: Not on file  . Years of education: Not on file  . Highest education level: 11th grade  Occupational History  . Not on file  Tobacco Use  . Smoking status: Never Smoker  . Smokeless tobacco: Never Used  Substance and Sexual Activity  . Alcohol use: No    Alcohol/week: 0.0 standard drinks  . Drug use: No  . Sexual activity: Yes  Other Topics Concern  . Not on file  Social History Narrative  . Not on file   Social Determinants of Health   Financial Resource Strain:   . Difficulty of Paying Living Expenses: Not on file  Food Insecurity:   . Worried About Charity fundraiser in the Last Year: Not on file  . Ran Out of Food in the Last Year: Not on file  Transportation Needs:   . Lack of Transportation (Medical): Not on file  . Lack of Transportation (Non-Medical): Not on file  Physical Activity:   . Days of Exercise per Week: Not on file  . Minutes of Exercise per Session: Not on file  Stress:   . Feeling  of Stress : Not on file  Social Connections:   . Frequency of Communication with Friends and Family: Not on file  . Frequency of Social Gatherings with Friends and Family: Not on file  . Attends Religious Services: Not on file  . Active Member of Clubs or Organizations: Not on file  . Attends Archivist Meetings: Not on file  . Marital Status: Not on file     Family History: The patient's family history includes Cancer in her father; Hyperlipidemia in her mother; Hypertension in her father, sister, and son; Hyperthyroidism in her mother; Stroke in her sister; Thyroid disease in her son.  ROS:   Please see the history of present illness.     All other systems reviewed and are negative.  EKGs/Labs/Other Studies  Reviewed:    The following studies were reviewed today:   EKG:  EKG is  ordered today.  The ekg ordered today demonstrates normal sinus rhythm, nonspecific T wave inversion V1 V2.  Recent Labs: 08/12/2019: ALT 13; BUN 29; Creatinine, Ser 1.19; Hemoglobin 13.6; Platelets 320; Potassium 4.2; Sodium 139; TSH 1.210  Recent Lipid Panel    Component Value Date/Time   CHOL 138 06/14/2019 0921   TRIG 184 (H) 06/14/2019 0921   HDL 36 (L) 06/14/2019 0921   CHOLHDL 3.2 02/05/2018 1450   LDLCALC 71 06/14/2019 0921    Physical Exam:    VS:  BP 124/80 (BP Location: Left Arm, Patient Position: Sitting, Cuff Size: Large)   Pulse 78   Ht 5\' 2"  (1.575 m)   Wt 198 lb (89.8 kg)   LMP  (LMP Unknown)   BMI 36.21 kg/m     Wt Readings from Last 3 Encounters:  08/13/19 198 lb (89.8 kg)  06/14/19 197 lb (89.4 kg)  10/22/18 198 lb (89.8 kg)     GEN:  Well nourished, well developed in no acute distress HEENT: Normal NECK: No JVD; No carotid bruits LYMPHATICS: No lymphadenopathy CARDIAC: RRR, no murmurs, rubs, gallops RESPIRATORY:  Clear to auscultation without rales, wheezing or rhonchi  ABDOMEN: Soft, non-tender, non-distended MUSCULOSKELETAL:  No edema; No deformity  SKIN: Warm and dry NEUROLOGIC:  Alert and oriented x 3 PSYCHIATRIC:  Normal affect   ASSESSMENT:    1. Dizziness   2. Nonspecific abnormal electrocardiogram (ECG) (EKG)   3. Essential hypertension   4. Pure hypercholesterolemia    PLAN:    In order of problems listed above:  1. Shin with symptoms of dizziness related with position.  Orthostatics at the physician's office was normal.  Her symptoms are consistent with positional vertigo.  She may follow-up with her primary care provider and or ENT for further management. 2. Nonspecific T wave inversions seen in V1 V2.  Patient has no symptoms of chest pain or shortness of breath.  No indication for further cardiac testing at this point.  Patient reassured. 3. History of  hypertension, blood pressure well controlled.  Continue current blood pressure meds. 4. 3 of hyperlipidemia, continue statin as prescribed.  This note was generated in part or whole with voice recognition software. Voice recognition is usually quite accurate but there are transcription errors that can and very often do occur. I apologize for any typographical errors that were not detected and corrected.  Medication Adjustments/Labs and Tests Ordered: Current medicines are reviewed at length with the patient today.  Concerns regarding medicines are outlined above.  Orders Placed This Encounter  Procedures  . EKG 12-Lead   No orders of the  defined types were placed in this encounter.   Patient Instructions  Medication Instructions:   1. Your physician recommends that you continue on your current medications as directed. Please refer to the Current Medication list given to you today.  *If you need a refill on your cardiac medications before your next appointment, please call your pharmacy*  Lab Work:  1. None Ordered   If you have labs (blood work) drawn today and your tests are completely normal, you will receive your results only by: [name] MyChart Message (if you have MyChart) OR . A paper copy in the mail If you have any lab test that is abnormal or we need to change your treatment, we will call you to review the results.  Testing/Procedures:  1. None ordered  Follow-Up: At [facility], you and your health needs are our priority.  As part of our continuing mission to provide you with exceptional heart care, we have created designated Provider Care Teams.  These Care Teams include your primary Cardiologist (physician) and Advanced Practice Providers (APPs -  Physician Assistants and Nurse Practitioners) who all work together to provide you with the care you need, when you need it.  Your next appointment:   Follow up as needed  The format for your next appointment:   Either In  Person or Virtual  Provider:    You may see [name], MD or one of the following Advanced Practice Providers on your designated Care Team:    Murray Hodgkins, NP  Christell Faith, PA-C  Marrianne Mood, PA-C        Signed, [name], MD  08/13/2019 12:21 PM    [place]

## 2019-08-16 ENCOUNTER — Telehealth: Payer: Self-pay | Admitting: Family Medicine

## 2019-08-16 DIAGNOSIS — E559 Vitamin D deficiency, unspecified: Secondary | ICD-10-CM

## 2019-08-16 MED ORDER — VITAMIN D (ERGOCALCIFEROL) 1.25 MG (50000 UNIT) PO CAPS: 50000.0000 [IU] | ORAL_CAPSULE | ORAL | 0 refills | Status: DC

## 2019-08-16 NOTE — Chronic Care Management (AMB) (Signed)
Chronic Care Management   Note  08/16/2019 Name: Alyssa Bryant MRN: [id number] DOB: Jul 16, 1950  Alyssa Bryant is a 69 y.o. year old female who is a primary care patient of [name], [place]. I reached out to [name] by phone today in response to a referral sent by Ms. [employer] health plan.     [name] was given information about Chronic Care Management services today including:  1. CCM service includes personalized support from designated clinical staff supervised by her physician, including individualized plan of care and coordination with other care providers 2. 24/7 contact phone numbers for assistance for urgent and routine care needs. 3. Service will only be billed when office clinical staff spend 20 minutes or more in a month to coordinate care. 4. Only one practitioner may furnish and bill the service in a calendar month. 5. The patient may stop CCM services at any time (effective at the end of the month) by phone call to the office staff. 6. The patient will be responsible for cost sharing (co-pay) of up to 20% of the service fee (after annual deductible is met).  Patient did not agree to enrollment in care management services and does not wish to consider at this time.  Follow up plan: The patient has been provided with contact information for the care management team and has been advised to call with any health related questions or concerns.   Noreene Larsson, [address], [place], [place] [address] Direct Dial: [phone] [email or web address] Website: [email or web address]

## 2019-08-16 NOTE — Telephone Encounter (Signed)
Patient notified and verbalized understanding. 

## 2019-08-16 NOTE — Telephone Encounter (Signed)
Please let her know that her labs look good but her vitamin D is low so I've sent a supplement to her pharmacy. Thanks!

## 2019-08-16 NOTE — Telephone Encounter (Signed)
Returned patient's call. Patient wanted to know about other blood work. Informed patient that Dr. Durenda Age message said everything else looks good.

## 2019-08-16 NOTE — Telephone Encounter (Signed)
Pt called to ask [place] another question concerning conversation they had today/ please advise

## 2019-10-04 ENCOUNTER — Other Ambulatory Visit: Payer: Self-pay | Admitting: Family Medicine

## 2019-10-04 NOTE — Telephone Encounter (Signed)
Requested medication (s) are due for refill today: yes  Requested medication (s) are on the active medication list: yes  Last refill:  06/14/19  Future visit scheduled: yes  Notes to clinic:  Cardiovascular: ACEI + Diuretic combos failed  Requested Prescriptions  Pending Prescriptions Disp Refills   lisinopril-hydrochlorothiazide (ZESTORETIC) 20-12.5 MG tablet [Pharmacy Med Name: Lisinopril-hydroCHLOROthiazide 20-12.5 MG Oral Tablet] 180 tablet 0    Sig: Take 2 tablets by mouth once daily      Cardiovascular:  ACEI + Diuretic Combos Failed - 10/04/2019 11:04 AM      Failed - Cr in normal range and within 180 days    Creatinine, Ser  Date Value Ref Range Status  08/12/2019 1.19 (H) 0.57 - 1.00 mg/dL Final          Passed - Na in normal range and within 180 days    Sodium  Date Value Ref Range Status  08/12/2019 139 134 - 144 mmol/L Final          Passed - K in normal range and within 180 days    Potassium  Date Value Ref Range Status  08/12/2019 4.2 3.5 - 5.2 mmol/L Final          Passed - Ca in normal range and within 180 days    Calcium  Date Value Ref Range Status  08/12/2019 9.3 8.7 - 10.3 mg/dL Final          Passed - Patient is not pregnant      Passed - Last BP in normal range    BP Readings from Last 1 Encounters:  08/13/19 124/80          Passed - Valid encounter within last 6 months    Recent Outpatient Visits           1 month ago Dizziness   [place], [place], DO   3 months ago Essential hypertension   [facility] [name], [place]   10 months ago Essential hypertension   [facility] [name], [place]   11 months ago Acute right-sided low back pain without sciatica   [facility], [place], [place]   1 year ago Acute suppurative otitis media of left ear with spontaneous rupture of tympanic membrane, recurrence not specified   [facility], Lilia Argue, [place]       Future Appointments             In 2 months Orene Desanctis, Lilia Argue, [place], [place]

## 2019-10-04 NOTE — Telephone Encounter (Signed)
Patient last seen 06/14/19 and has appointment 12/13/19. 90 tablets was sent in but patient takes 2 tabs daily.

## 2019-10-18 ENCOUNTER — Ambulatory Visit
Admission: RE | Admit: 2019-10-18 | Discharge: 2019-10-18 | Disposition: A | Discharge status: Home / Self Care | Payer: Medicare Other | Source: Home / Self Care | Attending: Family Medicine | Admitting: Family Medicine

## 2019-10-18 ENCOUNTER — Ambulatory Visit (INDEPENDENT_AMBULATORY_CARE_PROVIDER_SITE_OTHER): Payer: Medicare Other | Admitting: Family Medicine

## 2019-10-18 ENCOUNTER — Other Ambulatory Visit: Payer: Self-pay

## 2019-10-18 ENCOUNTER — Encounter: Payer: Self-pay | Admitting: Family Medicine

## 2019-10-18 ENCOUNTER — Ambulatory Visit
Admission: RE | Admit: 2019-10-18 | Discharge: 2019-10-18 | Disposition: A | Discharge status: Home / Self Care | Payer: Medicare Other | Source: Ambulatory Visit | Attending: Family Medicine | Admitting: Family Medicine

## 2019-10-18 VITALS — BP 121/74 | HR 92 | Temp 97.6°F | Ht 62.6 in | Wt 198.4 lb

## 2019-10-18 DIAGNOSIS — M79642 Pain in left hand: Secondary | ICD-10-CM | POA: Insufficient documentation

## 2019-10-18 DIAGNOSIS — M79641 Pain in right hand: Secondary | ICD-10-CM | POA: Insufficient documentation

## 2019-10-18 DIAGNOSIS — M7989 Other specified soft tissue disorders: Secondary | ICD-10-CM | POA: Diagnosis not present

## 2019-10-18 DIAGNOSIS — E559 Vitamin D deficiency, unspecified: Secondary | ICD-10-CM | POA: Diagnosis not present

## 2019-10-18 MED ORDER — PREDNISONE 10 MG PO TABS: ORAL_TABLET | ORAL | 0 refills | Status: DC

## 2019-10-18 MED ORDER — KETOROLAC TROMETHAMINE 60 MG/2ML IM SOLN
60.0000 mg | Freq: Once | INTRAMUSCULAR | Status: AC
  Administered 2019-10-18: 60 mg via INTRAMUSCULAR

## 2019-10-18 NOTE — Progress Notes (Signed)
BP 121/74 (BP Location: Left Arm, Patient Position: Sitting, Cuff Size: Normal)   Pulse 92   Temp 97.6 F (36.4 C) (Oral)   Ht 5' 2.6" (1.59 m)   Wt 198 lb 6.4 oz (90 kg)   LMP  (LMP Unknown)   SpO2 99%   BMI 35.60 kg/m    Subjective:    Patient ID: Alyssa Bryant, female    DOB: 09-Jun-1951, 69 y.o.   MRN: [id number]  HPI: Alyssa Bryant is a 69 y.o. female  Chief Complaint  Patient presents with  . Arthritis    hands.    HAND PAIN Duration: 1 week Involved hand: bilateral Mechanism of injury: unknown Location: palmar and dorsal and first 3 fingers Onset: sudden Severity: severe  Quality: throbbing Frequency: constant Radiation: into her L arm Treatments attempted: arthritis cream, advil Relief with NSAIDs?: no Weakness: yes Numbness: no Redness: no Swelling:no Bruising: no Fevers: no  Relevant past medical, surgical, family and social history reviewed and updated as indicated. Interim medical history since our last visit reviewed. Allergies and medications reviewed and updated.  Review of Systems  Constitutional: Negative.   Respiratory: Negative.   Cardiovascular: Negative.   Gastrointestinal: Negative.   Musculoskeletal: Positive for arthralgias. Negative for back pain, gait problem, joint swelling, myalgias, neck pain and neck stiffness.  Skin: Negative.   Neurological: Negative.   Hematological: Negative for adenopathy. Does not bruise/bleed easily.  Psychiatric/Behavioral: Negative.     Per HPI unless specifically indicated above     Objective:    BP 121/74 (BP Location: Left Arm, Patient Position: Sitting, Cuff Size: Normal)   Pulse 92   Temp 97.6 F (36.4 C) (Oral)   Ht 5' 2.6" (1.59 m)   Wt 198 lb 6.4 oz (90 kg)   LMP  (LMP Unknown)   SpO2 99%   BMI 35.60 kg/m   Wt Readings from Last 3 Encounters:  10/18/19 198 lb 6.4 oz (90 kg)  08/13/19 198 lb (89.8 kg)  06/14/19 197 lb (89.4 kg)    Physical Exam Vitals and nursing note reviewed.    Constitutional:      General: She is not in acute distress.    Appearance: Normal appearance. She is not ill-appearing, toxic-appearing or diaphoretic.  HENT:     Head: Normocephalic and atraumatic.     Right Ear: External ear normal.     Left Ear: External ear normal.     Nose: Nose normal.     Mouth/Throat:     Mouth: Mucous membranes are moist.     Pharynx: Oropharynx is clear.  Eyes:     General: No scleral icterus.       Right eye: No discharge.        Left eye: No discharge.     Extraocular Movements: Extraocular movements intact.     Conjunctiva/sclera: Conjunctivae normal.     Pupils: Pupils are equal, round, and reactive to light.  Cardiovascular:     Rate and Rhythm: Normal rate and regular rhythm.     Pulses: Normal pulses.     Heart sounds: Normal heart sounds. No murmur. No friction rub. No gallop.   Pulmonary:     Effort: Pulmonary effort is normal. No respiratory distress.     Breath sounds: Normal breath sounds. No stridor. No wheezing, rhonchi or rales.  Chest:     Chest wall: No tenderness.  Musculoskeletal:        General: Tenderness present. No swelling, deformity or signs of injury.  Normal range of motion.     Cervical back: Normal range of motion and neck supple.     Right lower leg: No edema.     Left lower leg: No edema.  Skin:    General: Skin is warm and dry.     Capillary Refill: Capillary refill takes less than 2 seconds.     Coloration: Skin is not jaundiced or pale.     Findings: No bruising, erythema, lesion or rash.  Neurological:     General: No focal deficit present.     Mental Status: She is alert and oriented to person, place, and time. Mental status is at baseline.  Psychiatric:        Mood and Affect: Mood normal.        Behavior: Behavior normal.        Thought Content: Thought content normal.        Judgment: Judgment normal.     Results for orders placed or performed in visit on 08/12/19  CBC with Differential/Platelet   Result Value Ref Range   WBC 7.4 3.4 - 10.8 x10E3/uL   RBC 4.50 3.77 - 5.28 x10E6/uL   Hemoglobin 13.6 11.1 - 15.9 g/dL   Hematocrit 39.9 34.0 - 46.6 %   MCV 89 79 - 97 fL   MCH 30.2 26.6 - 33.0 pg   MCHC 34.1 31.5 - 35.7 g/dL   RDW 12.7 11.7 - 15.4 %   Platelets 320 150 - 450 x10E3/uL   Neutrophils 74 Not Estab. %   Lymphs 18 Not Estab. %   Monocytes 6 Not Estab. %   Eos 0 Not Estab. %   Basos 1 Not Estab. %   Neutrophils Absolute 5.5 1.4 - 7.0 x10E3/uL   Lymphocytes Absolute 1.3 0.7 - 3.1 x10E3/uL   Monocytes Absolute 0.4 0.1 - 0.9 x10E3/uL   EOS (ABSOLUTE) 0.0 0.0 - 0.4 x10E3/uL   Basophils Absolute 0.0 0.0 - 0.2 x10E3/uL   Immature Granulocytes 1 Not Estab. %   Immature Grans (Abs) 0.1 0.0 - 0.1 x10E3/uL  Comprehensive metabolic panel  Result Value Ref Range   Glucose 100 (H) 65 - 99 mg/dL   BUN 29 (H) 8 - 27 mg/dL   Creatinine, Ser 1.19 (H) 0.57 - 1.00 mg/dL   GFR calc non Af Amer 46 (L) >59 mL/min/1.73   GFR calc Af Amer 53 (L) >59 mL/min/1.73   BUN/Creatinine Ratio 24 12 - 28   Sodium 139 134 - 144 mmol/L   Potassium 4.2 3.5 - 5.2 mmol/L   Chloride 104 96 - 106 mmol/L   CO2 22 20 - 29 mmol/L   Calcium 9.3 8.7 - 10.3 mg/dL   Total Protein 7.0 6.0 - 8.5 g/dL   Albumin 4.5 3.8 - 4.8 g/dL   Globulin, Total 2.5 1.5 - 4.5 g/dL   Albumin/Globulin Ratio 1.8 1.2 - 2.2   Bilirubin Total 0.2 0.0 - 1.2 mg/dL   Alkaline Phosphatase 115 39 - 117 IU/L   AST 18 0 - 40 IU/L   ALT 13 0 - 32 IU/L  TSH  Result Value Ref Range   TSH 1.210 0.450 - 4.500 uIU/mL  VITAMIN D 25 Hydroxy (Vit-D Deficiency, Fractures)  Result Value Ref Range   Vit D, 25-Hydroxy 12.6 (L) 30.0 - 100.0 ng/mL      Assessment & Plan:   Problem List Items Addressed This Visit      Other   Bilateral hand pain - Primary    Concern for  arthritis- unclear type. Will check labs and x-ray and treat with toradol and prednisone. Call with any concerns.      Relevant Medications   ketorolac (TORADOL)  injection 60 mg (Start on 10/18/2019 10:15 AM)   Other Relevant Orders   DG Hand Complete Left   DG Hand Complete Right   Sed Rate (ESR)   Comprehensive metabolic panel   CBC with Differential/Platelet   RA Qn+CCP(IgG/A)+SjoSSA+SjoSSB   Uric acid   VITAMIN D 25 Hydroxy (Vit-D Deficiency, Fractures)   CK (Creatine Kinase)       Follow up plan: Return in about 4 weeks (around 11/15/2019) for follow up with PCP.

## 2019-10-18 NOTE — Assessment & Plan Note (Signed)
Concern for arthritis- unclear type. Will check labs and x-ray and treat with toradol and prednisone. Call with any concerns.

## 2019-10-20 LAB — CBC WITH DIFFERENTIAL/PLATELET
Basophils Absolute: 0.1 10*3/uL (ref 0.0–0.2)
Basos: 1 %
EOS (ABSOLUTE): 0 10*3/uL (ref 0.0–0.4)
Eos: 1 %
Hematocrit: 42.2 % (ref 34.0–46.6)
Hemoglobin: 13.9 g/dL (ref 11.1–15.9)
Immature Grans (Abs): 0.1 10*3/uL (ref 0.0–0.1)
Immature Granulocytes: 1 %
Lymphocytes Absolute: 1 10*3/uL (ref 0.7–3.1)
Lymphs: 11 %
MCH: 29.9 pg (ref 26.6–33.0)
MCHC: 32.9 g/dL (ref 31.5–35.7)
MCV: 91 fL (ref 79–97)
Monocytes Absolute: 0.5 10*3/uL (ref 0.1–0.9)
Monocytes: 6 %
Neutrophils Absolute: 7.1 10*3/uL — ABNORMAL HIGH (ref 1.4–7.0)
Neutrophils: 80 %
Platelets: 431 10*3/uL (ref 150–450)
RBC: 4.65 x10E6/uL (ref 3.77–5.28)
RDW: 12.9 % (ref 11.7–15.4)
WBC: 8.8 10*3/uL (ref 3.4–10.8)

## 2019-10-20 LAB — COMPREHENSIVE METABOLIC PANEL
ALT: 12 IU/L (ref 0–32)
AST: 15 IU/L (ref 0–40)
Albumin/Globulin Ratio: 1.4 (ref 1.2–2.2)
Albumin: 4.2 g/dL (ref 3.7–4.7)
Alkaline Phosphatase: 106 IU/L (ref 39–117)
BUN/Creatinine Ratio: 27 (ref 12–28)
BUN: 29 mg/dL — ABNORMAL HIGH (ref 8–27)
Bilirubin Total: 0.4 mg/dL (ref 0.0–1.2)
CO2: 21 mmol/L (ref 20–29)
Calcium: 9.3 mg/dL (ref 8.7–10.3)
Chloride: 103 mmol/L (ref 96–106)
Creatinine, Ser: 1.07 mg/dL — ABNORMAL HIGH (ref 0.57–1.00)
GFR calc Af Amer: 60 mL/min/{1.73_m2} (ref >59)
GFR calc non Af Amer: 52 mL/min/{1.73_m2} — ABNORMAL LOW (ref >59)
Globulin, Total: 3.1 g/dL (ref 1.5–4.5)
Glucose: 103 mg/dL — ABNORMAL HIGH (ref 65–99)
Potassium: 4.4 mmol/L (ref 3.5–5.2)
Sodium: 139 mmol/L (ref 134–144)
Total Protein: 7.3 g/dL (ref 6.0–8.5)

## 2019-10-20 LAB — RA QN+CCP(IGG/A)+SJOSSA+SJOSSB
Cyclic Citrullin Peptide Ab: 3 units (ref 0–19)
ENA SSA (RO) Ab: <0.2 AI (ref 0.0–0.9)
ENA SSB (LA) Ab: <0.2 AI (ref 0.0–0.9)
Rheumatoid fact SerPl-aCnc: <10 IU/mL (ref 0.0–13.9)

## 2019-10-20 LAB — VITAMIN D 25 HYDROXY (VIT D DEFICIENCY, FRACTURES): Vit D, 25-Hydroxy: 40.6 ng/mL (ref 30.0–100.0)

## 2019-10-20 LAB — SEDIMENTATION RATE: Sed Rate: 23 mm/hr (ref 0–40)

## 2019-10-20 LAB — CK: Total CK: 86 U/L (ref 32–182)

## 2019-10-20 LAB — URIC ACID: Uric Acid: 7.5 mg/dL (ref 3.1–7.9)

## 2019-10-25 ENCOUNTER — Ambulatory Visit (INDEPENDENT_AMBULATORY_CARE_PROVIDER_SITE_OTHER): Payer: Medicare Other | Admitting: Family Medicine

## 2019-10-25 ENCOUNTER — Encounter: Payer: Self-pay | Admitting: Family Medicine

## 2019-10-25 ENCOUNTER — Other Ambulatory Visit: Payer: Self-pay

## 2019-10-25 VITALS — BP 147/84 | HR 76 | Temp 97.6°F | Wt 199.8 lb

## 2019-10-25 DIAGNOSIS — M545 Low back pain, unspecified: Secondary | ICD-10-CM

## 2019-10-25 DIAGNOSIS — R8281 Pyuria: Secondary | ICD-10-CM | POA: Diagnosis not present

## 2019-10-25 MED ORDER — KETOROLAC TROMETHAMINE 60 MG/2ML IM SOLN
60.0000 mg | Freq: Once | INTRAMUSCULAR | Status: AC
  Administered 2019-10-25: 60 mg via INTRAMUSCULAR

## 2019-10-25 MED ORDER — CYCLOBENZAPRINE HCL 10 MG PO TABS: 10.0000 mg | ORAL_TABLET | Freq: Every day | ORAL | 0 refills | Status: DC

## 2019-10-25 MED ORDER — NAPROXEN 500 MG PO TABS: 500.0000 mg | ORAL_TABLET | Freq: Two times a day (BID) | ORAL | 0 refills | Status: DC

## 2019-10-25 NOTE — Progress Notes (Signed)
BP (!) 147/84 (BP Location: Left Arm, Patient Position: Sitting, Cuff Size: Normal)   Pulse 76   Temp 97.6 F (36.4 C) (Oral)   Wt 199 lb 12.8 oz (90.6 kg)   LMP  (LMP Unknown)   SpO2 99%   BMI 35.85 kg/m    Subjective:    Patient ID: Alyssa Bryant, female    DOB: 1950/09/18, 69 y.o.   MRN: [id number]  HPI: Alyssa Bryant is a 69 y.o. female  Chief Complaint  Patient presents with  . Back Pain    Severe. Started late Saturday off and on.   BACK PAIN Duration: 2 days Mechanism of injury: unknown Location: Right and low back Onset: sudden Severity: severe Quality: sharp and throbbing Frequency: intermittent Radiation: none Aggravating factors: nothing Alleviating factors: nothing Status: stable Treatments attempted: rest, ice, heat, APAP, ibuprofen and aleve  Relief with NSAIDs?: no Nighttime pain:  yes Paresthesias / decreased sensation:  no Bowel / bladder incontinence:  no Fevers:  no Dysuria / urinary frequency:  no  Relevant past medical, surgical, family and social history reviewed and updated as indicated. Interim medical history since our last visit reviewed. Allergies and medications reviewed and updated.  Review of Systems  Constitutional: Negative.   Respiratory: Negative.   Cardiovascular: Negative.   Gastrointestinal: Negative.   Musculoskeletal: Positive for back pain and myalgias. Negative for arthralgias, gait problem, joint swelling, neck pain and neck stiffness.  Skin: Negative.   Neurological: Negative.   Psychiatric/Behavioral: Negative.     Per HPI unless specifically indicated above     Objective:    BP (!) 147/84 (BP Location: Left Arm, Patient Position: Sitting, Cuff Size: Normal)   Pulse 76   Temp 97.6 F (36.4 C) (Oral)   Wt 199 lb 12.8 oz (90.6 kg)   LMP  (LMP Unknown)   SpO2 99%   BMI 35.85 kg/m   Wt Readings from Last 3 Encounters:  10/25/19 199 lb 12.8 oz (90.6 kg)  10/18/19 198 lb 6.4 oz (90 kg)  08/13/19 198 lb (89.8  kg)    Physical Exam Vitals and nursing note reviewed.  Constitutional:      General: She is not in acute distress.    Appearance: Normal appearance. She is not ill-appearing, toxic-appearing or diaphoretic.  HENT:     Head: Normocephalic and atraumatic.     Right Ear: External ear normal.     Left Ear: External ear normal.     Nose: Nose normal.     Mouth/Throat:     Mouth: Mucous membranes are moist.     Pharynx: Oropharynx is clear.  Eyes:     General: No scleral icterus.       Right eye: No discharge.        Left eye: No discharge.     Extraocular Movements: Extraocular movements intact.     Conjunctiva/sclera: Conjunctivae normal.     Pupils: Pupils are equal, round, and reactive to light.  Cardiovascular:     Rate and Rhythm: Normal rate and regular rhythm.     Pulses: Normal pulses.     Heart sounds: Normal heart sounds. No murmur. No friction rub. No gallop.   Pulmonary:     Effort: Pulmonary effort is normal. No respiratory distress.     Breath sounds: Normal breath sounds. No stridor. No wheezing, rhonchi or rales.  Chest:     Chest wall: No tenderness.  Musculoskeletal:        General: Normal range of  motion.     Cervical back: Normal range of motion and neck supple.     Comments: Hypertonic paraspinals on the R, no CVA tenderness  Skin:    General: Skin is warm and dry.     Capillary Refill: Capillary refill takes less than 2 seconds.     Coloration: Skin is not jaundiced or pale.     Findings: No bruising, erythema, lesion or rash.  Neurological:     General: No focal deficit present.     Mental Status: She is alert and oriented to person, place, and time. Mental status is at baseline.  Psychiatric:        Mood and Affect: Mood normal.        Behavior: Behavior normal.        Thought Content: Thought content normal.        Judgment: Judgment normal.     Results for orders placed or performed in visit on 10/18/19  Sed Rate (ESR)  Result Value Ref Range    Sed Rate 23 0 - 40 mm/hr  Comprehensive metabolic panel  Result Value Ref Range   Glucose 103 (H) 65 - 99 mg/dL   BUN 29 (H) 8 - 27 mg/dL   Creatinine, Ser 1.07 (H) 0.57 - 1.00 mg/dL   GFR calc non Af Amer 52 (L) >59 mL/min/1.73   GFR calc Af Amer 60 >59 mL/min/1.73   BUN/Creatinine Ratio 27 12 - 28   Sodium 139 134 - 144 mmol/L   Potassium 4.4 3.5 - 5.2 mmol/L   Chloride 103 96 - 106 mmol/L   CO2 21 20 - 29 mmol/L   Calcium 9.3 8.7 - 10.3 mg/dL   Total Protein 7.3 6.0 - 8.5 g/dL   Albumin 4.2 3.7 - 4.7 g/dL   Globulin, Total 3.1 1.5 - 4.5 g/dL   Albumin/Globulin Ratio 1.4 1.2 - 2.2   Bilirubin Total 0.4 0.0 - 1.2 mg/dL   Alkaline Phosphatase 106 39 - 117 IU/L   AST 15 0 - 40 IU/L   ALT 12 0 - 32 IU/L  CBC with Differential/Platelet  Result Value Ref Range   WBC 8.8 3.4 - 10.8 x10E3/uL   RBC 4.65 3.77 - 5.28 x10E6/uL   Hemoglobin 13.9 11.1 - 15.9 g/dL   Hematocrit 42.2 34.0 - 46.6 %   MCV 91 79 - 97 fL   MCH 29.9 26.6 - 33.0 pg   MCHC 32.9 31.5 - 35.7 g/dL   RDW 12.9 11.7 - 15.4 %   Platelets 431 150 - 450 x10E3/uL   Neutrophils 80 Not Estab. %   Lymphs 11 Not Estab. %   Monocytes 6 Not Estab. %   Eos 1 Not Estab. %   Basos 1 Not Estab. %   Neutrophils Absolute 7.1 (H) 1.4 - 7.0 x10E3/uL   Lymphocytes Absolute 1.0 0.7 - 3.1 x10E3/uL   Monocytes Absolute 0.5 0.1 - 0.9 x10E3/uL   EOS (ABSOLUTE) 0.0 0.0 - 0.4 x10E3/uL   Basophils Absolute 0.1 0.0 - 0.2 x10E3/uL   Immature Granulocytes 1 Not Estab. %   Immature Grans (Abs) 0.1 0.0 - 0.1 x10E3/uL  RA Qn+CCP(IgG/A)+SjoSSA+SjoSSB  Result Value Ref Range   Rhuematoid fact SerPl-aCnc <10.0 0.0 - 13.9 IU/mL   ENA SSA (RO) Ab <0.2 0.0 - 0.9 AI   ENA SSB (LA) Ab <0.2 0.0 - 0.9 AI   Cyclic Citrullin Peptide Ab 3 0 - 19 units  Uric acid  Result Value Ref Range   Uric Acid  7.5 3.1 - 7.9 mg/dL  VITAMIN D 25 Hydroxy (Vit-D Deficiency, Fractures)  Result Value Ref Range   Vit D, 25-Hydroxy 40.6 30.0 - 100.0 ng/mL  CK  (Creatine Kinase)  Result Value Ref Range   Total CK 86 32 - 182 U/L      Assessment & Plan:   Problem List Items Addressed This Visit    None    Visit Diagnoses    Acute right-sided low back pain without sciatica    -  Primary   Will treat with toradol shot, naproxen and flexeril and stretches. Call with any concerns or if not getting better. Continue to monitor. Recheck 2 weeks.    Relevant Medications   ketorolac (TORADOL) injection 60 mg   naproxen (NAPROSYN) 500 MG tablet   cyclobenzaprine (FLEXERIL) 10 MG tablet   Other Relevant Orders   UA/M w/rflx Culture, Routine       Follow up plan: Return in about 2 weeks (around 11/08/2019).

## 2019-10-25 NOTE — Patient Instructions (Signed)

## 2019-10-27 ENCOUNTER — Telehealth: Payer: Self-pay | Admitting: Family Medicine

## 2019-10-27 LAB — UA/M W/RFLX CULTURE, ROUTINE
Bilirubin, UA: NEGATIVE
Glucose, UA: NEGATIVE
Ketones, UA: NEGATIVE
Nitrite, UA: NEGATIVE
Protein,UA: NEGATIVE
Specific Gravity, UA: 1.02 (ref 1.005–1.030)
Urobilinogen, Ur: 0.2 mg/dL (ref 0.2–1.0)
pH, UA: 5 (ref 5.0–7.5)

## 2019-10-27 LAB — MICROSCOPIC EXAMINATION

## 2019-10-27 LAB — URINE CULTURE, REFLEX

## 2019-10-27 NOTE — Telephone Encounter (Signed)
Pt called back to see if anything was advised about what she should do / Pt states she is still having back pain/ please advise

## 2019-10-27 NOTE — Telephone Encounter (Signed)
Copied from [place] [id number]. Topic: General - Other >> Oct 27, 2019 11:17 AM Keene Breath wrote: Reason for CRM: Patient called to inform the doctor that she is still having back pain.  She would like to know what she should do.  CB# [phone]

## 2019-10-27 NOTE — Telephone Encounter (Signed)
Please advise 

## 2019-10-27 NOTE — Telephone Encounter (Signed)
Saw Dr. Wynetta Emery for this

## 2019-10-28 NOTE — Telephone Encounter (Signed)
Patient notified of Dr. Durenda Age message. Patient declined PT at this time and she stated will wait a little longer to recover. Pt will let us know anything. FYI

## 2019-10-28 NOTE — Telephone Encounter (Signed)
It can definitely take some time to recover. She can do the stretches for a little longer or I can refer her to PT. Her choice.

## 2019-11-08 ENCOUNTER — Ambulatory Visit: Payer: Medicare Other | Admitting: Family Medicine

## 2019-11-08 ENCOUNTER — Telehealth: Payer: Self-pay | Admitting: Family Medicine

## 2019-11-08 NOTE — Telephone Encounter (Signed)
Requested medication (s) are due for refill today: no  Requested medication (s) are on the active medication list: yes  Last refill:  10/27/2019  Future visit scheduled: yes  Notes to clinic:  this refill cannot be delegated    Requested Prescriptions  Pending Prescriptions Disp Refills   predniSONE (DELTASONE) 10 MG tablet 21 tablet 0    Sig: 6 tabs today, 5 tabs tomorrow, decrease by 1 every day until gone. Take in the AM so it doesn't keep you awake.      Not Delegated - Endocrinology:  Oral Corticosteroids Failed - 11/08/2019  7:45 AM      Failed - This refill cannot be delegated      Failed - Last BP in normal range    BP Readings from Last 1 Encounters:  10/25/19 (!) 147/84          Passed - Valid encounter within last 6 months    Recent Outpatient Visits           2 weeks ago Acute right-sided low back pain without sciatica   [place], Megan P, DO   3 weeks ago Bilateral hand pain   [place], [place], DO   2 months ago Dizziness   [place], [place]ley, DO   4 months ago Essential hypertension   [place], [place], [place]   11 months ago Essential hypertension   [place], Lilia Argue, [place]       Future Appointments             In 1 month [facility]an[facility], Lilia Argue, [place], [place]

## 2019-11-08 NOTE — Telephone Encounter (Signed)
Pt would like refill of predniSONE (DELTASONE) 10 MG tablet  Pt states she was just in there 10/27/19, saw Dr Wynetta Emery and discussed her hand pain.  Pt given this med. Pt states her hand pain is returning, not as bad as it was, but does not want the pain to return like it was then.  Please advise if OK to refill.  [facility] (N), [place] - [address] Phone:  [phone]  Fax:  [phone]

## 2019-11-08 NOTE — Telephone Encounter (Signed)
Seen by Dr. Wynetta Emery for this

## 2019-11-11 NOTE — Telephone Encounter (Signed)
Patient notified

## 2019-11-11 NOTE — Telephone Encounter (Signed)
Not appropriate for refill 

## 2019-11-11 NOTE — Telephone Encounter (Signed)
Appointment scheduled.

## 2019-11-11 NOTE — Telephone Encounter (Signed)
Patient checking on the status of medication request for ongoing hand pain predniSONE (DELTASONE) 10 MG tablet , patient would like a follow up today. Please advise  [facility] [address] (N), [place] - [address] Phone:  [phone]  Fax:  [phone]

## 2019-11-15 ENCOUNTER — Ambulatory Visit: Payer: Medicare Other | Admitting: Family Medicine

## 2019-11-18 ENCOUNTER — Ambulatory Visit (INDEPENDENT_AMBULATORY_CARE_PROVIDER_SITE_OTHER): Payer: Medicare Other | Admitting: Family Medicine

## 2019-11-18 ENCOUNTER — Other Ambulatory Visit: Payer: Self-pay

## 2019-11-18 ENCOUNTER — Encounter: Payer: Self-pay | Admitting: Family Medicine

## 2019-11-18 VITALS — BP 138/78 | HR 82 | Temp 97.9°F | Ht 61.97 in | Wt 196.4 lb

## 2019-11-18 DIAGNOSIS — M79641 Pain in right hand: Secondary | ICD-10-CM | POA: Diagnosis not present

## 2019-11-18 DIAGNOSIS — G8929 Other chronic pain: Secondary | ICD-10-CM

## 2019-11-18 DIAGNOSIS — E559 Vitamin D deficiency, unspecified: Secondary | ICD-10-CM

## 2019-11-18 DIAGNOSIS — M25512 Pain in left shoulder: Secondary | ICD-10-CM

## 2019-11-18 DIAGNOSIS — M79642 Pain in left hand: Secondary | ICD-10-CM | POA: Diagnosis not present

## 2019-11-18 MED ORDER — VITAMIN D (ERGOCALCIFEROL) 1.25 MG (50000 UNIT) PO CAPS: 50000.0000 [IU] | ORAL_CAPSULE | ORAL | 1 refills | Status: DC

## 2019-11-18 MED ORDER — PREDNISONE 10 MG PO TABS: ORAL_TABLET | ORAL | 0 refills | Status: DC

## 2019-11-18 NOTE — Patient Instructions (Addendum)
Voltaren gel as needed up to 4 times per day Tylenol as needed Every once in a while an excedrin, advil, ibuprofen or something like that.  Glucosamine chondroitin

## 2019-11-18 NOTE — Progress Notes (Signed)
BP 138/78   Pulse 82   Temp 97.9 F (36.6 C)   Ht 5' 1.97" (1.574 m)   Wt 196 lb 6 oz (89.1 kg)   LMP  (LMP Unknown)   SpO2 97%   BMI 35.95 kg/m    Subjective:    Patient ID: Alyssa Bryant, female    DOB: 1951/05/13, 69 y.o.   MRN: [id number]  HPI: Alyssa Bryant is a 69 y.o. female  Chief Complaint  Patient presents with  . Hand Pain    bilateral   . Arm Pain    left   . Labs Only    Patient would like to know if she needs to have her Vitamin D rechecked since she finished the weekly medication   Presenting today for joint pain f/u. Presented several weeks ago with multijoint pain and swelling, has now been on prednisone, naproxen, and had toradol injections which seem to help for a bit but the pains come back. Taking tylenol as needed which does take the edge off a bit. Hands swollen, stiff, and very painful throughout day. Left shoulder also very bothersome. Takes advil every now and again as well. Had extensive labs recently to r/o inflammatory/autoimmune causes which was benign. Denies fevers, rashes, sweats, recent tick bites.   Relevant past medical, surgical, family and social history reviewed and updated as indicated. Interim medical history since our last visit reviewed. Allergies and medications reviewed and updated.  Review of Systems  Per HPI unless specifically indicated above     Objective:    BP 138/78   Pulse 82   Temp 97.9 F (36.6 C)   Ht 5' 1.97" (1.574 m)   Wt 196 lb 6 oz (89.1 kg)   LMP  (LMP Unknown)   SpO2 97%   BMI 35.95 kg/m   Wt Readings from Last 3 Encounters:  11/18/19 196 lb 6 oz (89.1 kg)  10/25/19 199 lb 12.8 oz (90.6 kg)  10/18/19 198 lb 6.4 oz (90 kg)    Physical Exam Vitals and nursing note reviewed.  Constitutional:      Appearance: Normal appearance. She is not ill-appearing.  HENT:     Head: Atraumatic.  Eyes:     Extraocular Movements: Extraocular movements intact.     Conjunctiva/sclera: Conjunctivae normal.    Cardiovascular:     Rate and Rhythm: Normal rate and regular rhythm.     Heart sounds: Normal heart sounds.  Pulmonary:     Effort: Pulmonary effort is normal.     Breath sounds: Normal breath sounds.  Musculoskeletal:        General: No swelling, tenderness or deformity. Normal range of motion.     Cervical back: Normal range of motion and neck supple.  Skin:    General: Skin is warm and dry.     Findings: No erythema or rash.  Neurological:     Mental Status: She is alert and oriented to person, place, and time.  Psychiatric:        Mood and Affect: Mood normal.        Thought Content: Thought content normal.        Judgment: Judgment normal.     Results for orders placed or performed in visit on 10/25/19  Microscopic Examination   URINE  Result Value Ref Range   WBC, UA 6-10 (A) 0 - 5 /hpf   RBC 3-10 (A) 0 - 2 /hpf   Epithelial Cells (non renal) 0-10 0 - 10 /hpf  Bacteria, UA Few (A) None seen/Few  Urine Culture, Reflex   URINE  Result Value Ref Range   Urine Culture, Routine Final report    Organism ID, Bacteria Comment   UA/M w/rflx Culture, Routine   Specimen: Urine   URINE  Result Value Ref Range   Specific Gravity, UA 1.020 1.005 - 1.030   pH, UA 5.0 5.0 - 7.5   Color, UA Yellow Yellow   Appearance Ur Hazy (A) Clear   Leukocytes,UA 2+ (A) Negative   Protein,UA Negative Negative/Trace   Glucose, UA Negative Negative   Ketones, UA Negative Negative   RBC, UA Trace (A) Negative   Bilirubin, UA Negative Negative   Urobilinogen, Ur 0.2 0.2 - 1.0 mg/dL   Nitrite, UA Negative Negative   Microscopic Examination See below:    Urinalysis Reflex Comment       Assessment & Plan:   Problem List Items Addressed This Visit      Other   Bilateral hand pain - Primary   Vitamin D deficiency    Pt requesting to stay on weekly supplement dose, doing well on these. Recent labs show excellent supplementation       Other Visit Diagnoses    Chronic left shoulder  pain       Relevant Medications   predniSONE (DELTASONE) 10 MG tablet     Acute flare of chronic arthritic pain suspected, restart prednisone taper, diclofenac gel, tylenol prn. Stay active, may try glucosamine or tumeric supplements additionally.   Follow up plan: Return for as scheduled.

## 2019-11-18 NOTE — Assessment & Plan Note (Signed)
Pt requesting to stay on weekly supplement dose, doing well on these. Recent labs show excellent supplementation

## 2019-11-22 DIAGNOSIS — H5203 Hypermetropia, bilateral: Secondary | ICD-10-CM | POA: Diagnosis not present

## 2019-11-22 DIAGNOSIS — H53021 Refractive amblyopia, right eye: Secondary | ICD-10-CM | POA: Diagnosis not present

## 2019-11-22 DIAGNOSIS — H25813 Combined forms of age-related cataract, bilateral: Secondary | ICD-10-CM | POA: Diagnosis not present

## 2019-11-22 DIAGNOSIS — H31092 Other chorioretinal scars, left eye: Secondary | ICD-10-CM | POA: Diagnosis not present

## 2019-11-22 DIAGNOSIS — H35363 Drusen (degenerative) of macula, bilateral: Secondary | ICD-10-CM | POA: Diagnosis not present

## 2019-11-26 ENCOUNTER — Other Ambulatory Visit: Payer: Self-pay | Admitting: Unknown Physician Specialty

## 2019-12-08 DIAGNOSIS — B078 Other viral warts: Secondary | ICD-10-CM | POA: Diagnosis not present

## 2019-12-08 DIAGNOSIS — D239 Other benign neoplasm of skin, unspecified: Secondary | ICD-10-CM | POA: Diagnosis not present

## 2019-12-08 DIAGNOSIS — B353 Tinea pedis: Secondary | ICD-10-CM | POA: Diagnosis not present

## 2019-12-08 DIAGNOSIS — L718 Other rosacea: Secondary | ICD-10-CM | POA: Diagnosis not present

## 2019-12-11 IMAGING — DX DG HAND COMPLETE 3+V*R*
3 series · 3 of 3 positions shown · non-contrast
Comparison: No prior.

CLINICAL DATA: Hand pain and swelling.

EXAM:
RIGHT HAND - COMPLETE 3+ VIEW

[hand ap]
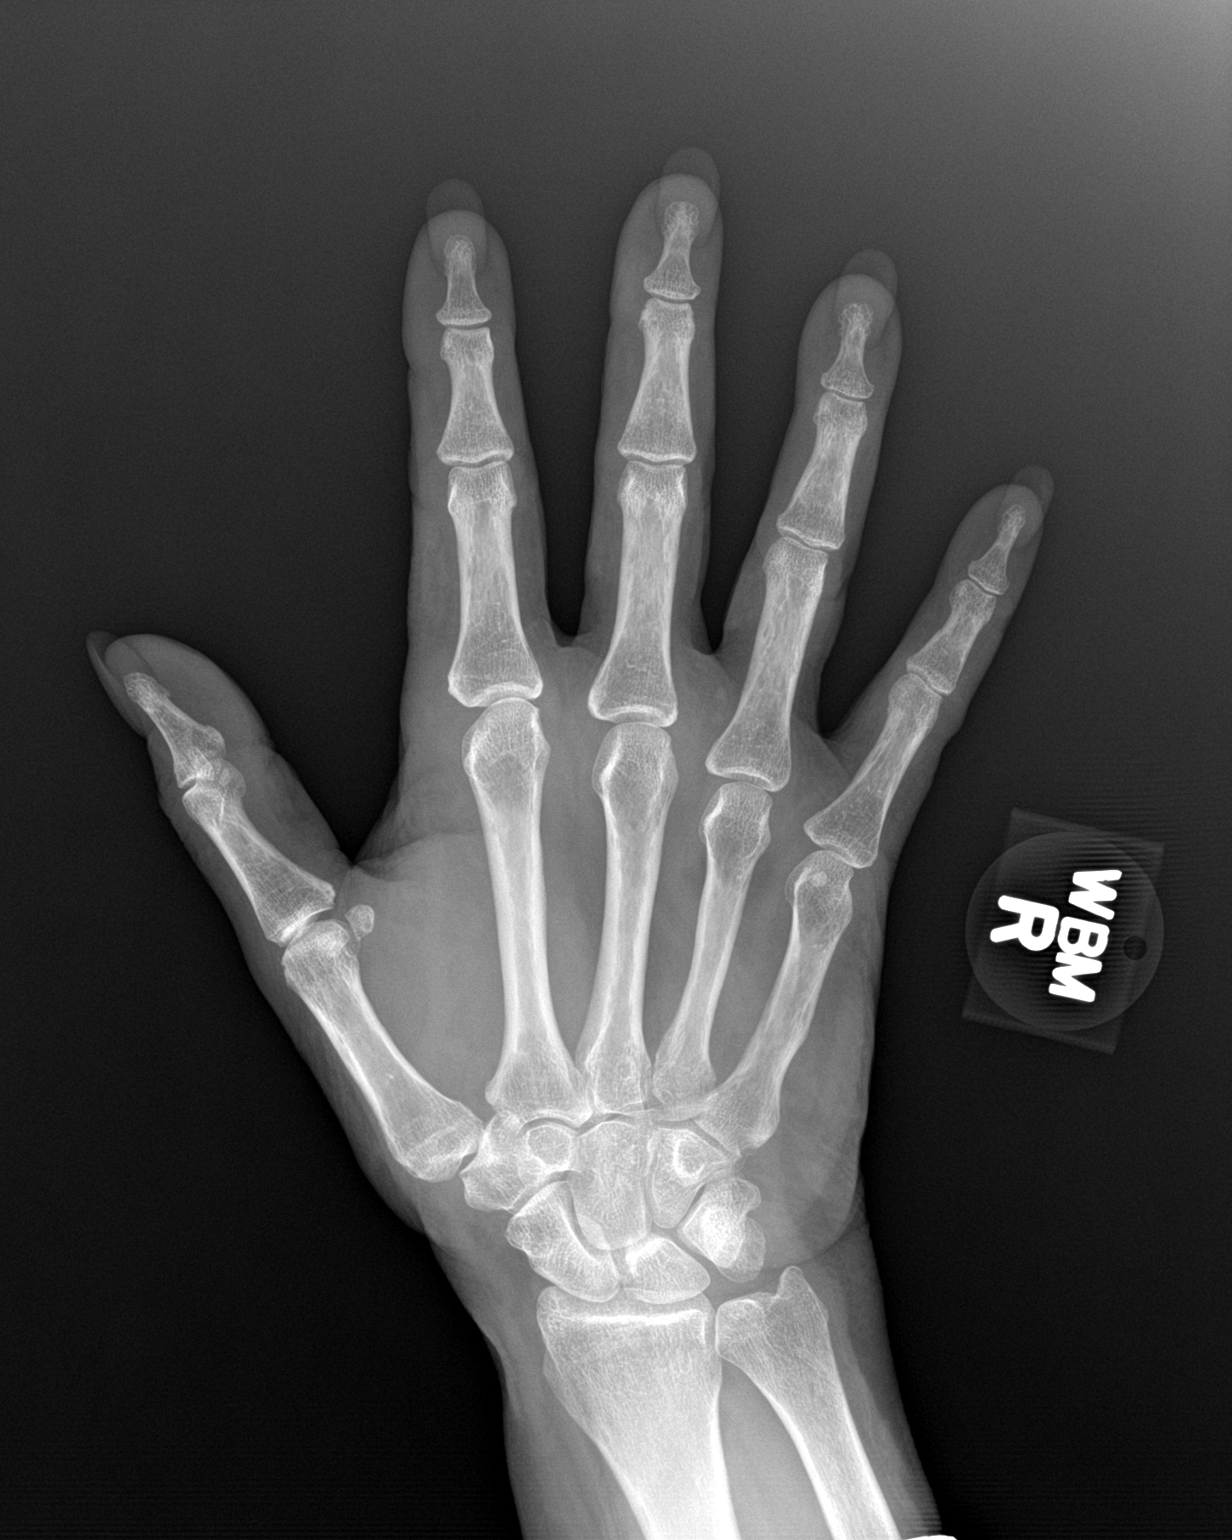

[hand obl]
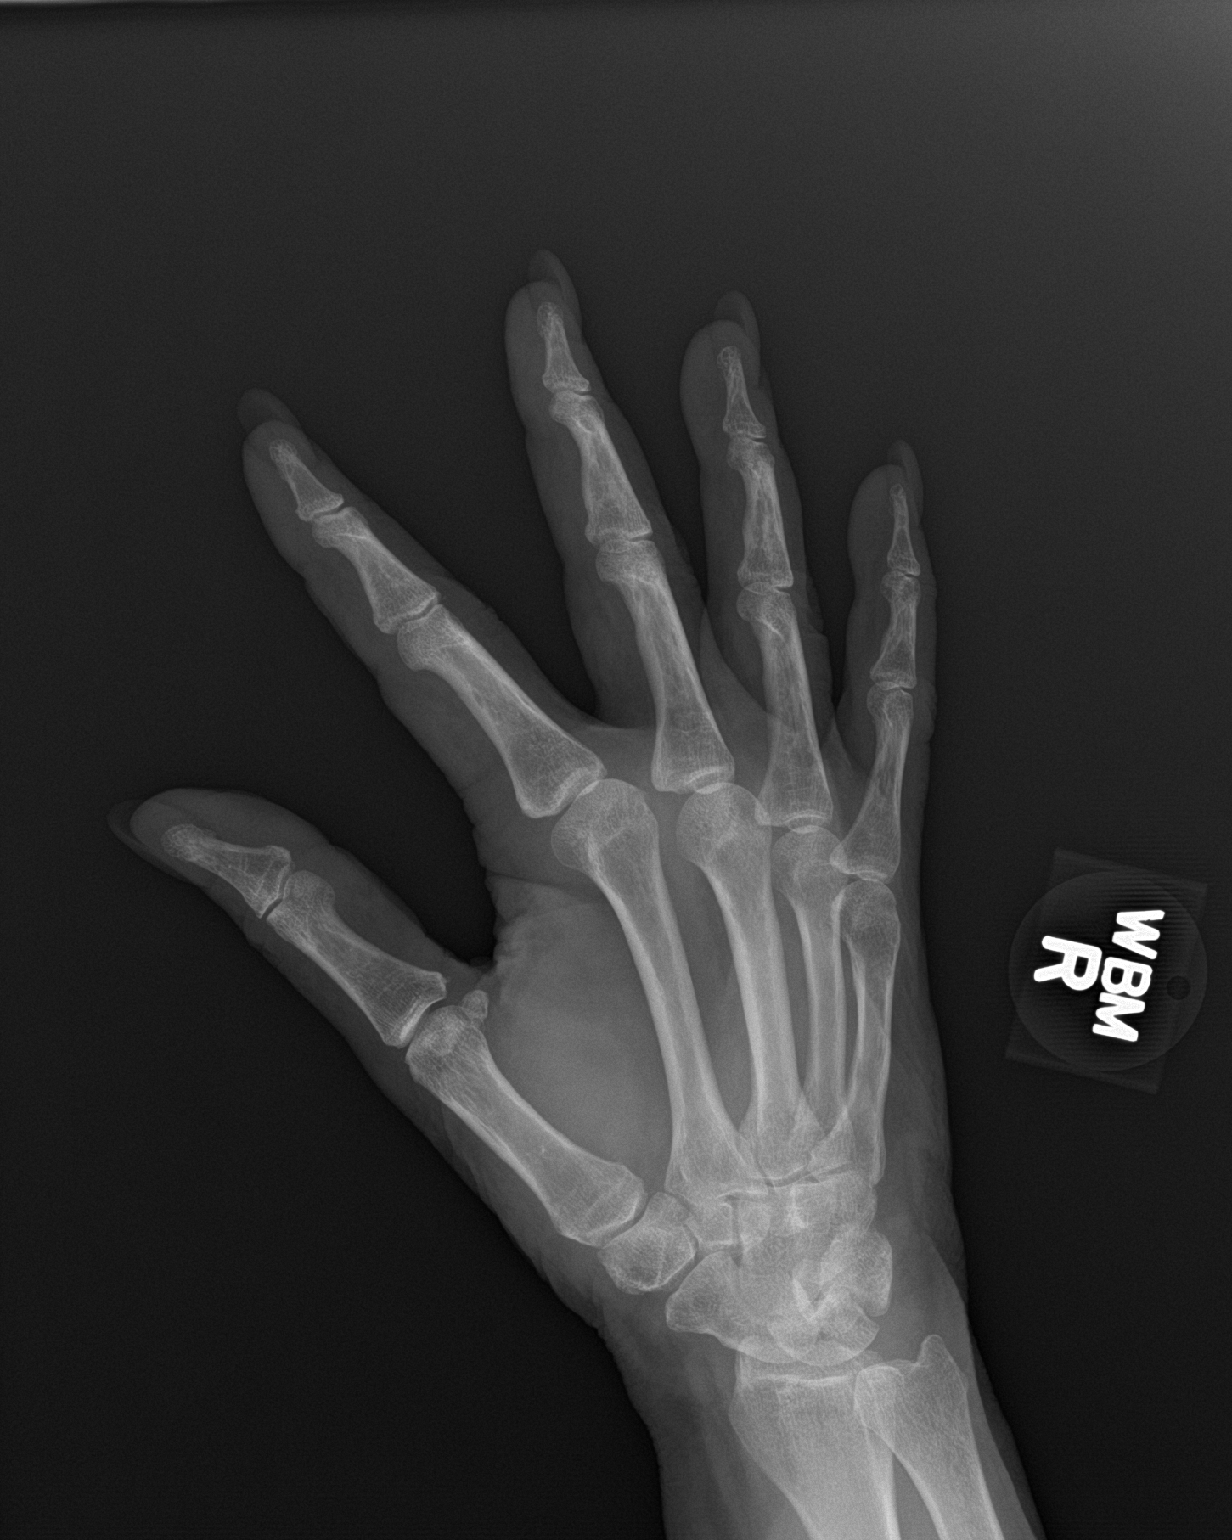

[hand lat]
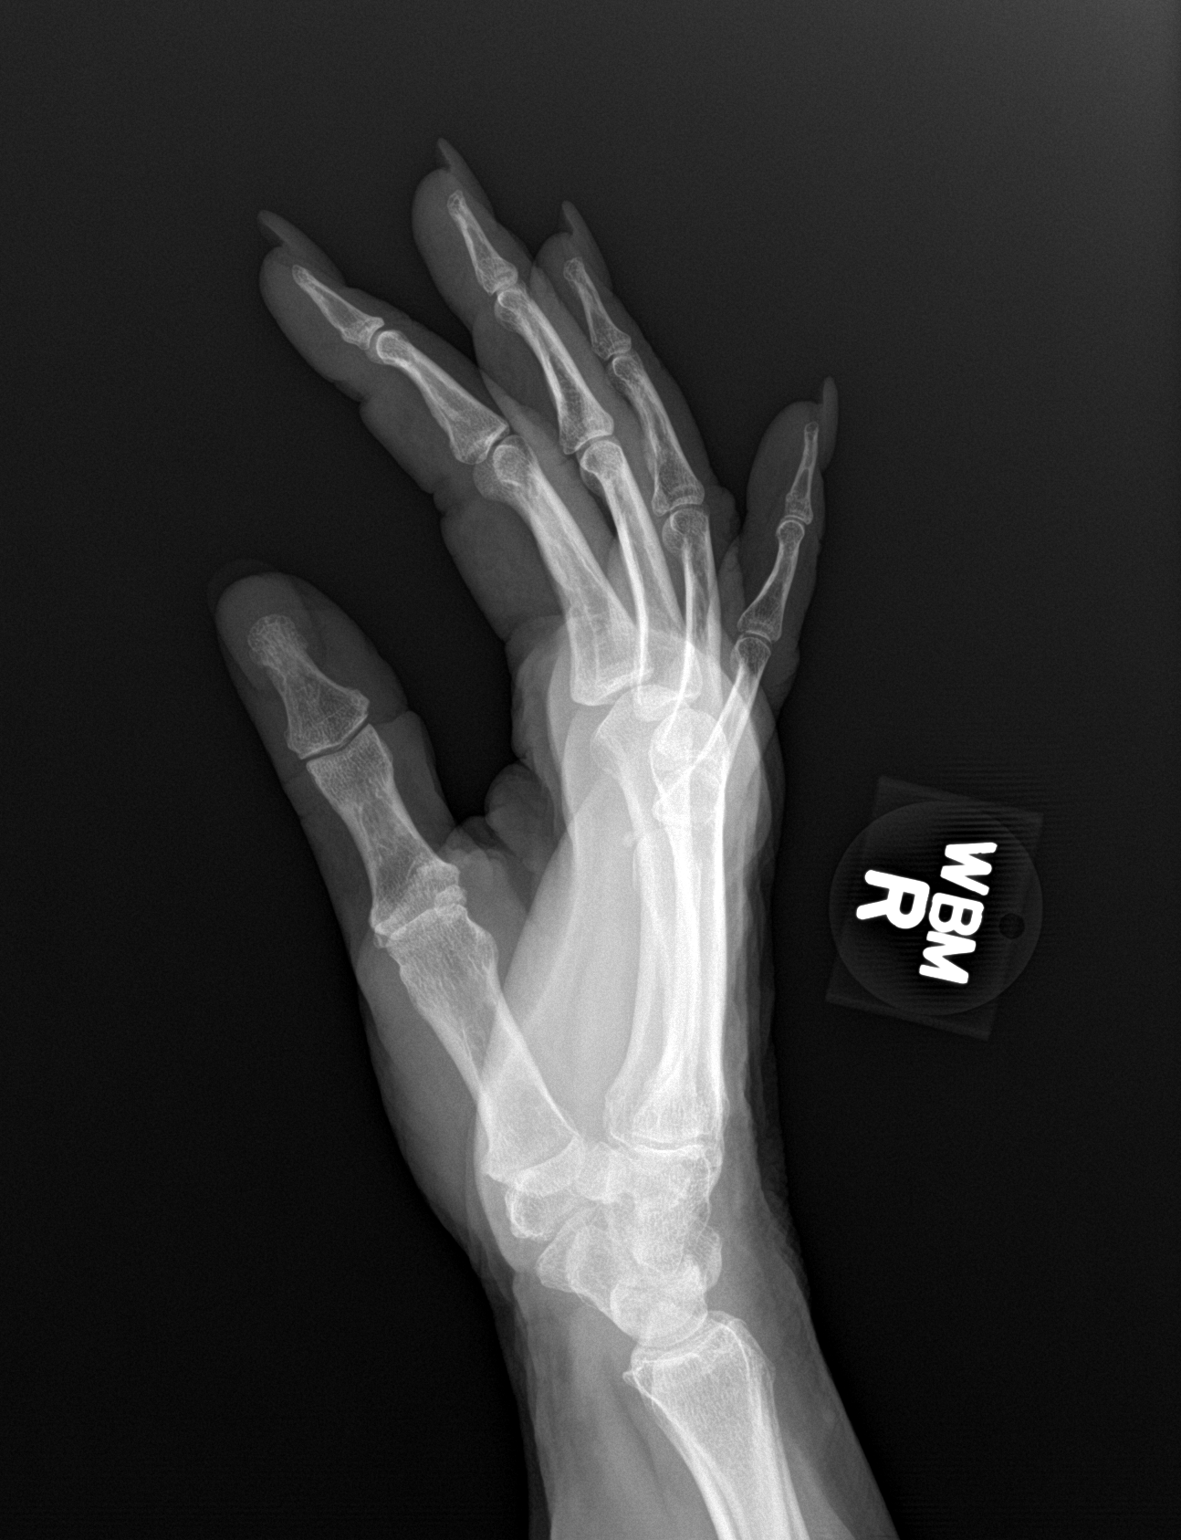

[3 of 3 positions shown; findings below may reference images not displayed]

FINDINGS: Soft tissue structures are unremarkable. No evidence of fracture or
dislocation.
IMPRESSION: No acute abnormality.

## 2019-12-11 IMAGING — DX DG HAND COMPLETE 3+V*L*
3 series · 3 of 3 positions shown · non-contrast
Comparison: No recent prior.

CLINICAL DATA: Joint swelling.

EXAM:
LEFT HAND - COMPLETE 3+ VIEW

[hand ap]
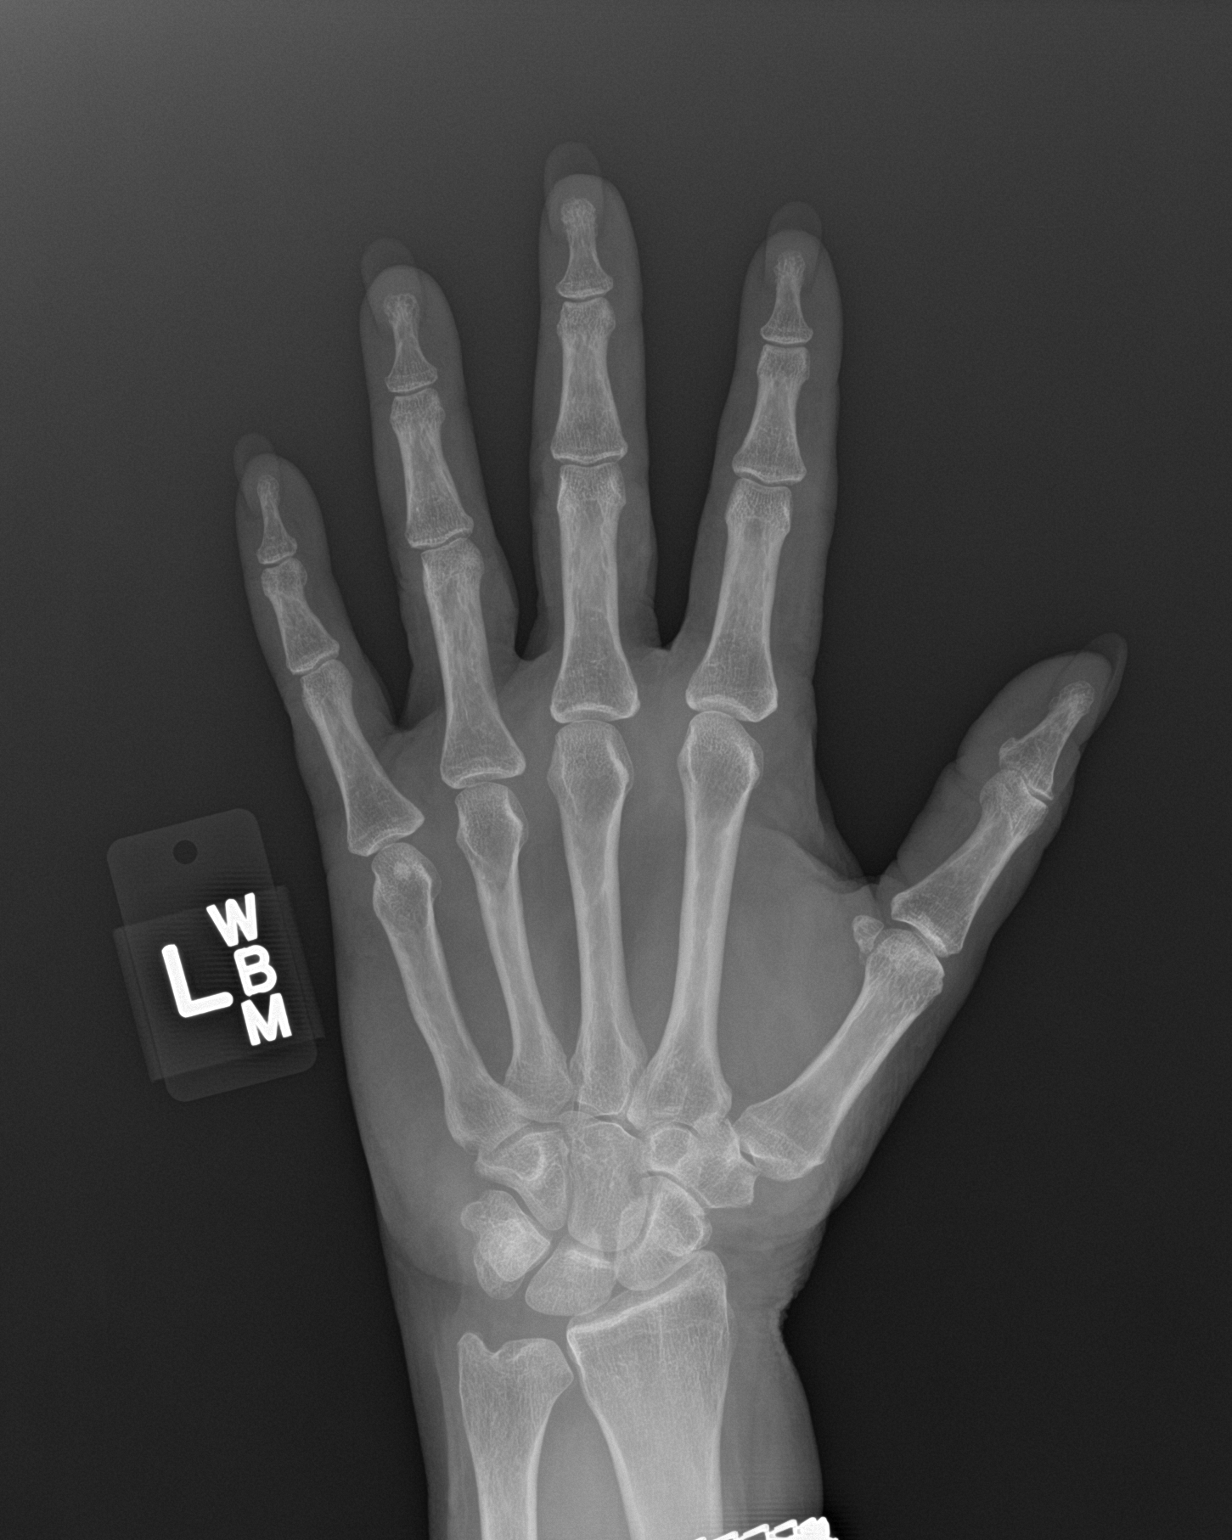

[hand obl]
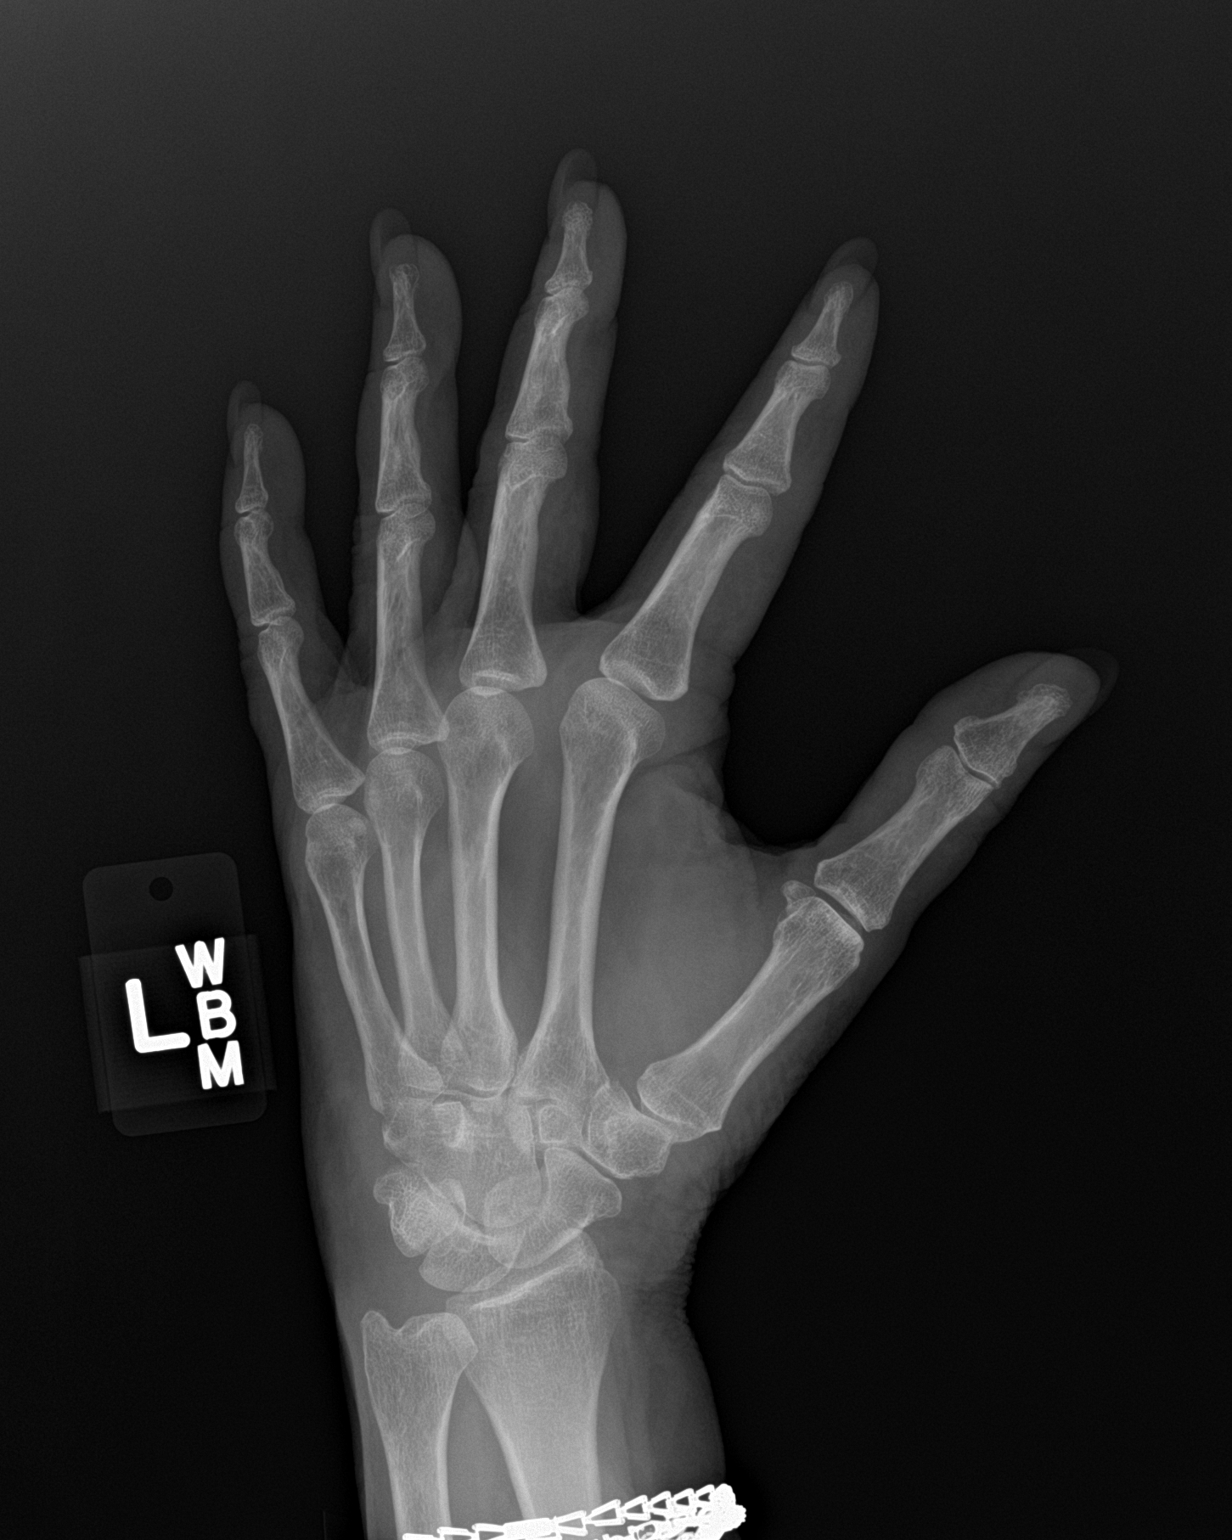

[hand lat]
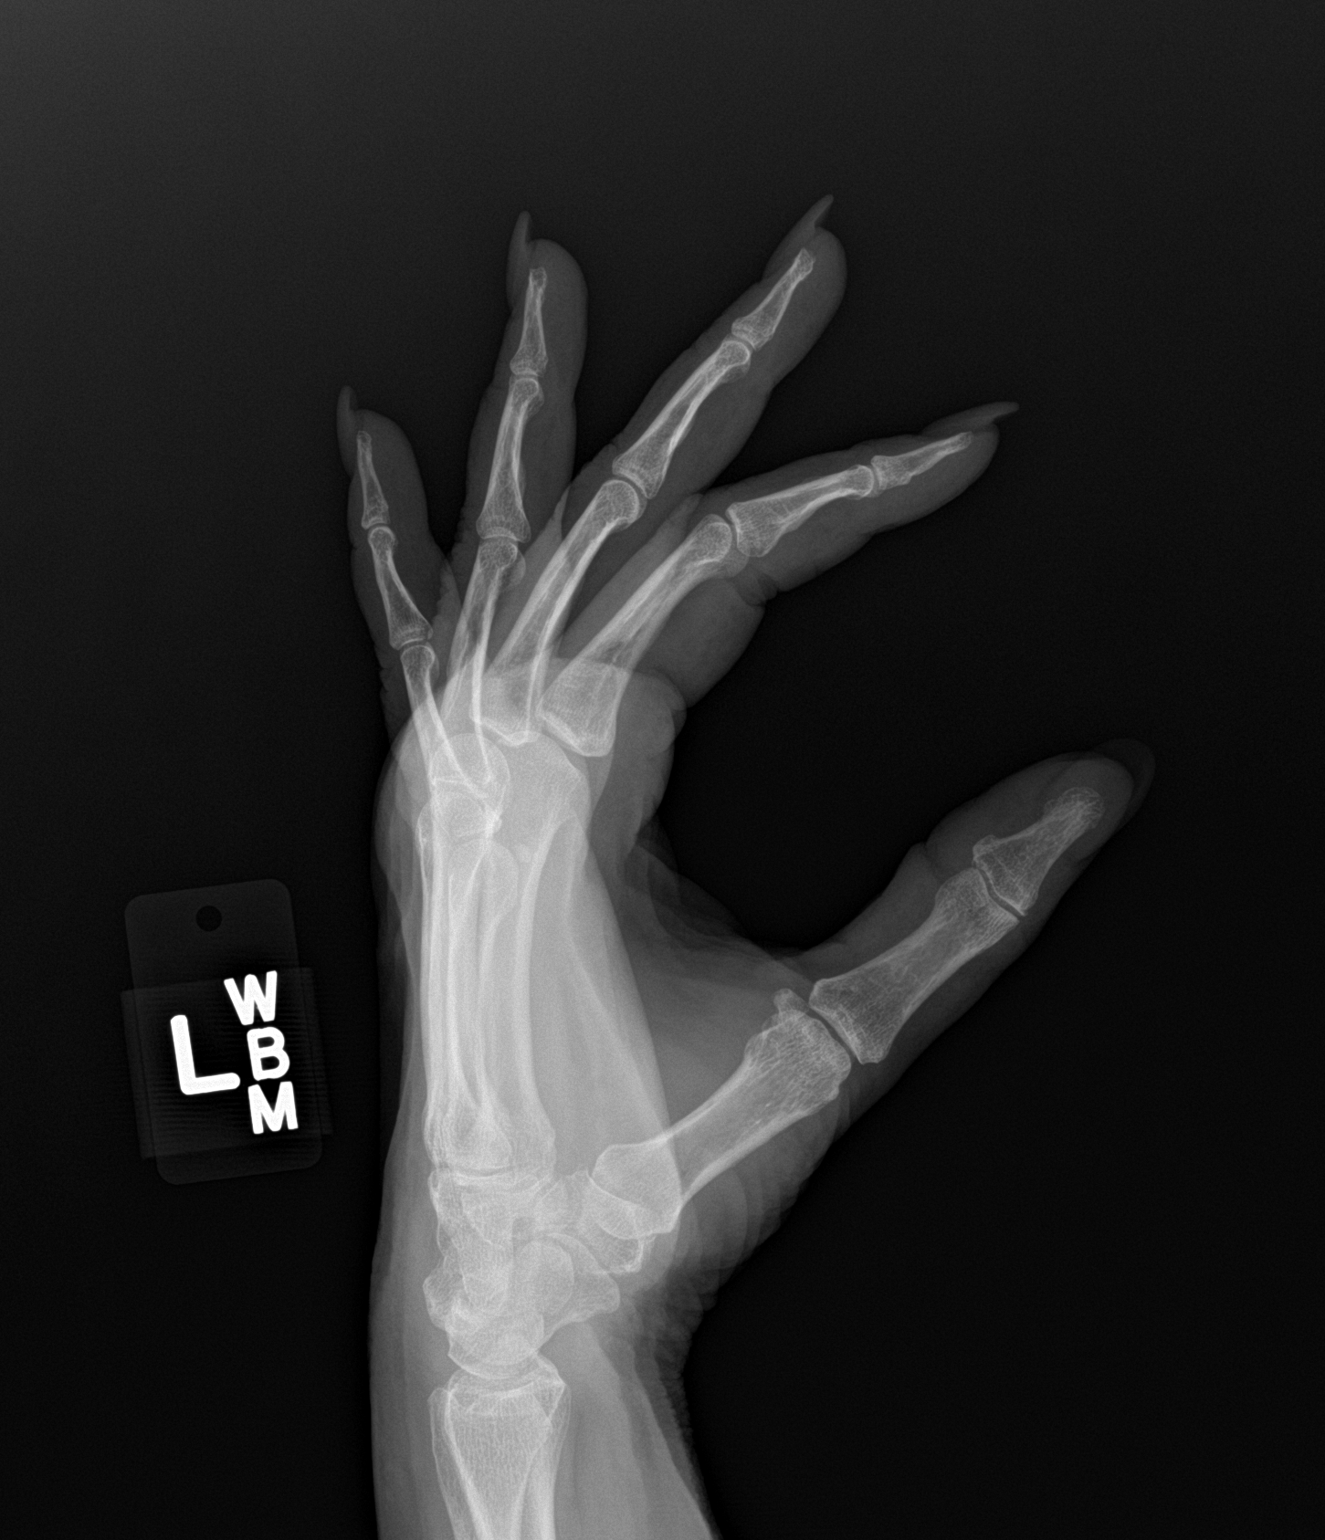

[3 of 3 positions shown; findings below may reference images not displayed]

FINDINGS: No acute bony or joint abnormality. No evidence of fracture or
dislocation. Soft tissues are unremarkable.
IMPRESSION: No acute abnormality.

## 2019-12-13 ENCOUNTER — Encounter: Payer: Medicare Other | Admitting: Family Medicine

## 2019-12-29 ENCOUNTER — Other Ambulatory Visit: Payer: Self-pay | Admitting: Family Medicine

## 2019-12-31 ENCOUNTER — Emergency Department: Payer: Medicare Other

## 2019-12-31 ENCOUNTER — Ambulatory Visit: Payer: Self-pay | Admitting: *Deleted

## 2019-12-31 ENCOUNTER — Other Ambulatory Visit: Payer: Self-pay

## 2019-12-31 ENCOUNTER — Emergency Department
Admission: EM | Admit: 2019-12-31 | Discharge: 2019-12-31 | Disposition: A | Discharge status: Home / Self Care | Payer: Medicare Other | Attending: Emergency Medicine | Admitting: Emergency Medicine

## 2019-12-31 DIAGNOSIS — R079 Chest pain, unspecified: Secondary | ICD-10-CM | POA: Diagnosis not present

## 2019-12-31 DIAGNOSIS — Z79899 Other long term (current) drug therapy: Secondary | ICD-10-CM | POA: Diagnosis not present

## 2019-12-31 DIAGNOSIS — E785 Hyperlipidemia, unspecified: Secondary | ICD-10-CM | POA: Insufficient documentation

## 2019-12-31 DIAGNOSIS — E039 Hypothyroidism, unspecified: Secondary | ICD-10-CM | POA: Diagnosis not present

## 2019-12-31 DIAGNOSIS — R0789 Other chest pain: Secondary | ICD-10-CM | POA: Insufficient documentation

## 2019-12-31 DIAGNOSIS — I10 Essential (primary) hypertension: Secondary | ICD-10-CM | POA: Insufficient documentation

## 2019-12-31 LAB — CBC WITH DIFFERENTIAL/PLATELET
Abs Immature Granulocytes: 0.06 10*3/uL (ref 0.00–0.07)
Basophils Absolute: 0 10*3/uL (ref 0.0–0.1)
Basophils Relative: 1 %
Eosinophils Absolute: 0 10*3/uL (ref 0.0–0.5)
Eosinophils Relative: 0 %
HCT: 40.1 % (ref 36.0–46.0)
Hemoglobin: 13.4 g/dL (ref 12.0–15.0)
Immature Granulocytes: 1 %
Lymphocytes Relative: 16 %
Lymphs Abs: 1.2 10*3/uL (ref 0.7–4.0)
MCH: 29.6 pg (ref 26.0–34.0)
MCHC: 33.4 g/dL (ref 30.0–36.0)
MCV: 88.5 fL (ref 80.0–100.0)
Monocytes Absolute: 0.6 10*3/uL (ref 0.1–1.0)
Monocytes Relative: 7 %
Neutro Abs: 6 10*3/uL (ref 1.7–7.7)
Neutrophils Relative %: 75 %
Platelets: 340 10*3/uL (ref 150–400)
RBC: 4.53 MIL/uL (ref 3.87–5.11)
RDW: 13.7 % (ref 11.5–15.5)
WBC: 7.9 10*3/uL (ref 4.0–10.5)
nRBC: 0 % (ref 0.0–0.2)

## 2019-12-31 LAB — BASIC METABOLIC PANEL
Anion gap: 10 (ref 5–15)
BUN: 39 mg/dL — ABNORMAL HIGH (ref 8–23)
CO2: 24 mmol/L (ref 22–32)
Calcium: 9.1 mg/dL (ref 8.9–10.3)
Chloride: 106 mmol/L (ref 98–111)
Creatinine, Ser: 1.11 mg/dL — ABNORMAL HIGH (ref 0.44–1.00)
GFR calc Af Amer: 58 mL/min — ABNORMAL LOW (ref >60)
GFR calc non Af Amer: 50 mL/min — ABNORMAL LOW (ref >60)
Glucose, Bld: 100 mg/dL — ABNORMAL HIGH (ref 70–99)
Potassium: 3.7 mmol/L (ref 3.5–5.1)
Sodium: 140 mmol/L (ref 135–145)

## 2019-12-31 LAB — TROPONIN I (HIGH SENSITIVITY)
Troponin I (High Sensitivity): 10 ng/L (ref <18)
Troponin I (High Sensitivity): 9 ng/L (ref <18)

## 2019-12-31 NOTE — ED Notes (Addendum)
Pt denies history of resp-related conditions. Sat 91-94% RA; resp reg/unlabored. Denies feeling SOB. Requesting to eat/drink. EDP Siadecki notified.

## 2019-12-31 NOTE — Discharge Instructions (Addendum)
Return to the ER for new, worsening, or persistent severe chest pain, difficulty breathing, weakness, or any other new or worsening symptoms that concern you.  Follow-up with your primary care doctor. 

## 2019-12-31 NOTE — ED Triage Notes (Signed)
Pt arrives via POV for reports of left side chest pain intermittently since Tuesday. Denies N/V, dizziness or shob. Pt also reports left sided arm pain with no known injury. Pt in NAD, A&Ox4.

## 2019-12-31 NOTE — ED Notes (Signed)
Pt and husband briefly updated. Pt resting calmly in bed. Bed locked low. Rail up. Call bell within reach.

## 2019-12-31 NOTE — Telephone Encounter (Signed)
Patient is calling to report that she is having left chest pain and left shoulder pain. She had the pain on Tuesday and then it went away- it is back today. It gets worse with movement- if she lays down she feels better- advised ED for evaluation of symptoms.  Reason for Disposition  Pain also in shoulder(s) or arm(s) or jaw (Exception: pain is clearly made worse by movement)  Answer Assessment - Initial Assessment Questions 1. LOCATION: "Where does it hurt?"       Sore L side over breast 2. RADIATION: "Does the pain go anywhere else?" (e.g., into neck, jaw, arms, back)     Left arm hurts at the top 3. ONSET: "When did the chest pain begin?" (Minutes, hours or days)      Tuesday, yesterday 4. PATTERN "Does the pain come and go, or has it been constant since it started?"  "Does it get worse with exertion?"      Comes and goes, yes 5. DURATION: "How long does it last" (e.g., seconds, minutes, hours)     Seconds to hours 6. SEVERITY: "How bad is the pain?"  (e.g., Scale 1-10; mild, moderate, or severe)    - MILD (1-3): doesn't interfere with normal activities     - MODERATE (4-7): interferes with normal activities or awakens from sleep    - SEVERE (8-10): excruciating pain, unable to do any normal activities       Mild 7. CARDIAC RISK FACTORS: "Do you have any history of heart problems or risk factors for heart disease?" (e.g., angina, prior heart attack; diabetes, high blood pressure, high cholesterol, smoker, or strong family history of heart disease)     High BP, cholesterol  8. PULMONARY RISK FACTORS: "Do you have any history of lung disease?"  (e.g., blood clots in lung, asthma, emphysema, birth control pills)     no 9. CAUSE: "What do you think is causing the chest pain?"     No idea 10. OTHER SYMPTOMS: "Do you have any other symptoms?" (e.g., dizziness, nausea, vomiting, sweating, fever, difficulty breathing, cough)       no 11. PREGNANCY: "Is there any chance you are pregnant?"  "When was your last menstrual period?"       n/a  Protocols used: CHEST PAIN-A-AH

## 2019-12-31 NOTE — ED Provider Notes (Signed)
[facility] Emergency Department Provider Note ____________________________________________   First MD Initiated Contact with Patient 12/31/19 [phone]     (approximate)  I have reviewed the triage vital signs and the nursing notes.   HISTORY  Chief Complaint Chest Pain    HPI Naraya Stoneberg is a 69 y.o. female with PMH as noted below including hypertension, hyperlipidemia, thyroid disease who presents with chest pain over the last 3 days, mainly in the left chest, which she states is just a "hurting pain" and not sharp or pressure-like.  She states that initially started 3 days ago, resolved, and then came back last night and has been present since then.  There is no associated shortness of breath, lightheadedness, or nausea.  She states it is worse when she is standing and better when she lies down, but is not associated with exertion.  She has some aching in her left arm, but the pain does not radiate to her back or elsewhere.  She denies any prior history of similar pain, and has no history of heart disease.  She denies any leg pain or swelling.  Past Medical History:  Diagnosis Date  . Arthritis    back  . Dyspnea   . Hyperlipidemia   . Hypertension   . Hypothyroidism   . Thyroid disease     Patient Active Problem List   Diagnosis Date Noted  . Vitamin D deficiency 08/16/2019  . Dizziness 08/12/2019  . Bilateral hand pain 03/12/2018  . Advanced care planning/counseling discussion 09/03/2016  . Plantar fasciitis 07/10/2016  . Osteopenia 09/05/2015  . Mixed hyperlipidemia 09/05/2015  . Hypothyroidism 09/05/2015  . Hypertension 09/05/2015    Past Surgical History:  Procedure Laterality Date  . ABDOMINAL HYSTERECTOMY  1978   partial  . COLONOSCOPY WITH PROPOFOL N/A 11/03/2017   Procedure: COLONOSCOPY WITH PROPOFOL;  Surgeon: [name], MD;  Location: [place];  Service: Endoscopy;  Laterality: N/A;    Prior to Admission medications     Medication Sig Start Date End Date Taking? Authorizing Provider  ASPIRIN 81 PO Take by mouth daily.    [provider]  atorvastatin (LIPITOR) 20 MG tablet Take 1 tablet by mouth once daily 11/26/19   [name], PA-C  cyclobenzaprine (FLEXERIL) 10 MG tablet Take 1 tablet (10 mg total) by mouth at bedtime. Patient not taking: Reported on 11/18/2019 10/25/19   Park Liter P, DO  levothyroxine (SYNTHROID) 100 MCG tablet Take 1 tablet (100 mcg total) by mouth daily. 06/14/19   [name], PA-C  lisinopril-hydrochlorothiazide (ZESTORETIC) 20-12.5 MG tablet Take 2 tablets by mouth once daily 12/29/19   [name], PA-C  naproxen (NAPROSYN) 500 MG tablet Take 1 tablet (500 mg total) by mouth 2 (two) times daily with a meal. Patient not taking: Reported on 11/18/2019 10/25/19   Park Liter P, DO  predniSONE (DELTASONE) 10 MG tablet Take 6 tabs daily x 2 days, 5 tabs daily x 2 days, 4 tabs daily x 2 days, etc 11/18/19   [name], PA-C  Vitamin D, Ergocalciferol, (DRISDOL) 1.25 MG (50000 UNIT) CAPS capsule Take 1 capsule (50,000 Units total) by mouth every 7 (seven) days. 11/18/19   [name], PA-C    Allergies Sulfa antibiotics  Family History  Problem Relation Age of Onset  . Hyperlipidemia Mother   . Hyperthyroidism Mother   . Cancer Father        lymphoma  . Hypertension Father   . Stroke  Sister   . Hypertension Son   . Thyroid disease Son   . Hypertension Sister     Social History Social History   Tobacco Use  . Smoking status: Never Smoker  . Smokeless tobacco: Never Used  Vaping Use  . Vaping Use: Never used  Substance Use Topics  . Alcohol use: No    Alcohol/week: 0.0 standard drinks  . Drug use: No    Review of Systems  Constitutional: No fever/chills. Eyes: No visual changes. ENT: No sore throat. Cardiovascular: Positive for chest pain. Respiratory: Denies shortness of breath. Gastrointestinal:  No vomiting or diarrhea.  Genitourinary: Negative for flank pain.  Musculoskeletal: Negative for back pain. Skin: Negative for rash. Neurological: Negative for headache.   ____________________________________________   PHYSICAL EXAM:  VITAL SIGNS: ED Triage Vitals  Enc Vitals Group     BP 12/31/19 0941 (!) 149/64     Pulse Rate 12/31/19 0941 88     Resp --      Temp 12/31/19 0941 98.1 F (36.7 C)     Temp Source 12/31/19 0941 Oral     SpO2 12/31/19 0941 98 %     Weight 12/31/19 0943 180 lb (81.6 kg)     Height 12/31/19 0943 5\' 2"  (1.575 m)     Head Circumference --      Peak Flow --      Pain Score --      Pain Loc --      Pain Edu? --      Excl. in [place]? --     Constitutional: Alert and oriented. Well appearing and in no acute distress. Eyes: Conjunctivae are normal.  Head: Atraumatic. Nose: No congestion/rhinnorhea. Mouth/Throat: Mucous membranes are moist.   Neck: Normal range of motion.  Cardiovascular: Normal rate, regular rhythm. Grossly normal heart sounds.  Good peripheral circulation. Respiratory: Normal respiratory effort.  No retractions. Lungs CTAB. Gastrointestinal: No distention.  Musculoskeletal: No lower extremity edema.  Extremities warm and well perfused.  Neurologic:  Normal speech and language. No gross focal neurologic deficits are appreciated.  Skin:  Skin is warm and dry. No rash noted. Psychiatric: Mood and affect are normal. Speech and behavior are normal.  ____________________________________________   LABS (all labs ordered are listed, but only abnormal results are displayed)  Labs Reviewed  BASIC METABOLIC PANEL - Abnormal; Notable for the following components:      Result Value   Glucose, Bld 100 (*)    BUN 39 (*)    Creatinine, Ser 1.11 (*)    GFR calc non Af Amer 50 (*)    GFR calc Af Amer 58 (*)    All other components within normal limits  CBC WITH DIFFERENTIAL/PLATELET  TROPONIN I (HIGH SENSITIVITY)  TROPONIN I (HIGH  SENSITIVITY)   ____________________________________________  EKG  ED ECG REPORT I, Arta Silence, the attending physician, personally viewed and interpreted this ECG.  Date: 12/31/2019 EKG Time: 0937 Rate: 81 Rhythm: normal sinus rhythm QRS Axis: normal Intervals: normal ST/T Wave abnormalities: normal Narrative Interpretation: no evidence of acute ischemia  ____________________________________________  RADIOLOGY  CXR: No focal infiltrate or edema  ____________________________________________   PROCEDURES  Procedure(s) performed: No  Procedures  Critical Care performed: No ____________________________________________   INITIAL IMPRESSION / ASSESSMENT AND PLAN / ED COURSE  Pertinent labs & imaging results that were available during my care of the patient were reviewed by me and considered in my medical decision making (see chart for details).  69 year old female with PMH as  noted above presents with somewhat atypical and nonexertional chest pain intermittently over the last few days.  It is somewhat positional and has no significant associated symptoms.  I reviewed the past medical records in epic.  The patient has no recent prior ED visits or admissions.  She has no prior cardiac history.  On exam she is overall well-appearing.  Her vital signs are normal except for mild hypertension.  The physical exam is otherwise unremarkable.  EKG is nonischemic.  Overall presentation is most consistent with musculoskeletal chest pain, the patient states that she was doing some vigorous yard work a few days ago.  Differential includes GERD or less likely ACS.  Given the patient's age and risk factors we will obtain ACS work-up with troponins x2.  There is no clinical evidence for DVT or PE, and the patient is not tachycardic or hypoxic.  She denies any shortness of breath.  There is also no clinical evidence for aortic dissection or other vascular etiology.  I anticipate  discharge home if the work-up is negative.  ----------------------------------------- 1:23 PM on 12/31/2019 -----------------------------------------  Initial and repeat troponin are both negative.  The patient reports that the pain has subsided.  She continues to appear comfortable.  There is no evidence of ACS at this time.  She is stable for discharge.  I counseled her on the results of the work-up.  Return precautions given, and she expresses understanding.   ____________________________________________   FINAL CLINICAL IMPRESSION(S) / ED DIAGNOSES  Final diagnoses:  Atypical chest pain      NEW MEDICATIONS STARTED DURING THIS VISIT:  New Prescriptions   No medications on file     Note:  This document was prepared using Dragon voice recognition software and may include unintentional dictation errors.    Arta Silence, MD 12/31/19 1324

## 2019-12-31 NOTE — Telephone Encounter (Signed)
Patient currently at ER

## 2019-12-31 NOTE — Telephone Encounter (Signed)
Agree with disposition. 

## 2019-12-31 NOTE — ED Notes (Addendum)
Current O2 sat 97% RA. Will address with [place] if pt's sat drops again and sustains. Verbal okay from [place] for pt to eat/drink. Given snack/food tray and drink. Husband remains at bedside.

## 2020-01-04 ENCOUNTER — Telehealth: Payer: Self-pay | Admitting: Family Medicine

## 2020-01-04 NOTE — Telephone Encounter (Signed)
Copied from [place] [id number]. Topic: Medicare AWV >> Jan 04, 2020 11:54 AM Cher Nakai R wrote: Reason for CRM:  Left message for patient to call back and schedule the Medicare Annual Wellness Visit (AWV) virtually  Last AWV 12/30/2018  Please schedule at anytime with [facility]-Nurse Health Advisor.  45 minute appointment

## 2020-02-04 ENCOUNTER — Ambulatory Visit: Payer: Medicare Other

## 2020-03-12 ENCOUNTER — Other Ambulatory Visit: Payer: Self-pay | Admitting: Family Medicine

## 2020-03-12 NOTE — Telephone Encounter (Signed)
Requested Prescriptions  Pending Prescriptions Disp Refills   atorvastatin (LIPITOR) 20 MG tablet [Pharmacy Med Name: Atorvastatin Calcium 20 MG Oral Tablet] 90 tablet 0    Sig: Take 1 tablet by mouth once daily     Cardiovascular:  Antilipid - Statins Failed - 03/12/2020  5:47 PM      Failed - LDL in normal range and within 360 days    LDL Chol Calc (NIH)  Date Value Ref Range Status  06/14/2019 71 0 - 99 mg/dL Final         Failed - HDL in normal range and within 360 days    HDL  Date Value Ref Range Status  06/14/2019 36 (L) >39 mg/dL Final         Failed - Triglycerides in normal range and within 360 days    Triglycerides  Date Value Ref Range Status  06/14/2019 184 (H) 0 - 149 mg/dL Final         Passed - Total Cholesterol in normal range and within 360 days    Cholesterol, Total  Date Value Ref Range Status  06/14/2019 138 100 - 199 mg/dL Final         Passed - Patient is not pregnant      Passed - Valid encounter within last 12 months    Recent Outpatient Visits          3 months ago Bilateral hand pain   [facility] [name], [place]   4 months ago Acute right-sided low back pain without sciatica   [facility], Megan P, DO   4 months ago Bilateral hand pain   [place], [place], DO   7 months ago Dizziness   [place], [place], DO   9 months ago Essential hypertension   [place], [place], [place]

## 2020-03-14 ENCOUNTER — Telehealth: Payer: Self-pay | Admitting: Family Medicine

## 2020-03-14 NOTE — Telephone Encounter (Signed)
Copied from [place] [id number]. Topic: Medicare AWV >> Mar 14, 2020  3:07 PM Cher Nakai R wrote: Reason for CRM:   Left message for patient to call back and schedule the Medicare Annual Wellness Visit (AWV) virtually.  Last AWV  12/30/2018  Please schedule at anytime with [facility]-Nurse Health Advisor.  45 minute appointment  Any questions, please call me at [phone]

## 2020-03-29 ENCOUNTER — Other Ambulatory Visit: Payer: Self-pay | Admitting: Family Medicine

## 2020-06-12 ENCOUNTER — Other Ambulatory Visit: Payer: Self-pay | Admitting: Family Medicine

## 2020-06-12 NOTE — Telephone Encounter (Signed)
LVM TO MAKE THIS APT FOR MED REFILLS

## 2020-06-12 NOTE — Telephone Encounter (Signed)
Previous RL patient. Over due for routine follow up. Routing to admin to schedule and to provider for refill.

## 2020-06-12 NOTE — Telephone Encounter (Signed)
   Last refill:  03/18/2020  Future visit scheduled: no  Notes to clinic:  medication last filled by Merrie Roof Review for refill   Requested Prescriptions  Pending Prescriptions Disp Refills   atorvastatin (LIPITOR) 20 MG tablet [Pharmacy Med Name: Atorvastatin Calcium 20 MG Oral Tablet] 90 tablet 0    Sig: Take 1 tablet by mouth once daily      Cardiovascular:  Antilipid - Statins Failed - 06/12/2020  1:38 PM      Failed - Total Cholesterol in normal range and within 360 days    Cholesterol, Total  Date Value Ref Range Status  06/14/2019 138 100 - 199 mg/dL Final          Failed - LDL in normal range and within 360 days    LDL Chol Calc (NIH)  Date Value Ref Range Status  06/14/2019 71 0 - 99 mg/dL Final          Failed - HDL in normal range and within 360 days    HDL  Date Value Ref Range Status  06/14/2019 36 (L) >39 mg/dL Final          Failed - Triglycerides in normal range and within 360 days    Triglycerides  Date Value Ref Range Status  06/14/2019 184 (H) 0 - 149 mg/dL Final          Passed - Patient is not pregnant      Passed - Valid encounter within last 12 months    Recent Outpatient Visits           6 months ago Bilateral hand pain   [facility] [name], [place]   7 months ago Acute right-sided low back pain without sciatica   [facility], Megan P, DO   7 months ago Bilateral hand pain   [place], [place], DO   10 months ago Dizziness   [place], [place], DO   12 months ago Essential hypertension   [place], [place], [place]

## 2020-06-13 NOTE — Telephone Encounter (Signed)
LVM TO MAKE THIS APT FOR MED REFILLS

## 2020-06-14 ENCOUNTER — Encounter: Payer: Self-pay | Admitting: Family Medicine

## 2020-06-14 NOTE — Telephone Encounter (Signed)
LVM TO MAKE THIS APT FOR MED REFILLS

## 2020-06-14 NOTE — Telephone Encounter (Signed)
Sent letter

## 2020-06-18 ENCOUNTER — Other Ambulatory Visit: Payer: Self-pay | Admitting: Family Medicine

## 2020-06-18 NOTE — Telephone Encounter (Signed)
Requested Prescriptions  Pending Prescriptions Disp Refills  . levothyroxine (SYNTHROID) 100 MCG tablet [Pharmacy Med Name: Levothyroxine Sodium 100 MCG Oral Tablet] 90 tablet 0    Sig: Take 1 tablet by mouth once daily     Endocrinology:  Hypothyroid Agents Failed - 06/18/2020  2:13 PM      Failed - TSH needs to be rechecked within 3 months after an abnormal result. Refill until TSH is due.      Passed - TSH in normal range and within 360 days    TSH  Date Value Ref Range Status  08/12/2019 1.210 0.450 - 4.500 uIU/mL Final         Passed - Valid encounter within last 12 months    Recent Outpatient Visits          7 months ago Bilateral hand pain   [place], [place]   7 months ago Acute right-sided low back pain without sciatica   [facility], Megan P, DO   8 months ago Bilateral hand pain   [place], [place], DO   10 months ago Dizziness   [place], [place], DO   1 year ago Essential hypertension   [place], [place], [place]

## 2020-07-06 ENCOUNTER — Other Ambulatory Visit: Payer: Self-pay | Admitting: Family Medicine

## 2020-07-06 NOTE — Telephone Encounter (Signed)
Pt has not been seen for this recently and does not have upcoming appointment scheduled.

## 2020-07-06 NOTE — Telephone Encounter (Signed)
Patient called, left VM to return the call to the office to schedule an OV.

## 2020-07-06 NOTE — Telephone Encounter (Signed)
Requested medication (s) are due for refill today: Yes  Requested medication (s) are on the active medication list: Yes  Last refill:  03/29/20  Future visit scheduled: No  Notes to clinic:  Unable to refill per protocol, appointment needed     Requested Prescriptions  Pending Prescriptions Disp Refills   lisinopril-hydrochlorothiazide (ZESTORETIC) 20-12.5 MG tablet [Pharmacy Med Name: Lisinopril-hydroCHLOROthiazide 20-12.5 MG Oral Tablet] 180 tablet 0    Sig: Take 2 tablets by mouth once daily      Cardiovascular:  ACEI + Diuretic Combos Failed - 07/06/2020  9:13 AM      Failed - Na in normal range and within 180 days    Sodium  Date Value Ref Range Status  12/31/2019 140 135 - 145 mmol/L Final  10/18/2019 139 134 - 144 mmol/L Final          Failed - K in normal range and within 180 days    Potassium  Date Value Ref Range Status  12/31/2019 3.7 3.5 - 5.1 mmol/L Final          Failed - Cr in normal range and within 180 days    Creatinine, Ser  Date Value Ref Range Status  12/31/2019 1.11 (H) 0.44 - 1.00 mg/dL Final          Failed - Ca in normal range and within 180 days    Calcium  Date Value Ref Range Status  12/31/2019 9.1 8.9 - 10.3 mg/dL Final          Failed - Valid encounter within last 6 months    Recent Outpatient Visits           7 months ago Bilateral hand pain   [facility] Particia Nearing, PA-C   8 months ago Acute right-sided low back pain without sciatica   [facility] [place], Megan P, DO   8 months ago Bilateral hand pain   [facility], [place], DO   10 months ago Dizziness   [facility] [place], [place], DO   1 year ago Essential hypertension   [facility] Roosvelt Maser [place], [place]                Passed - Patient is not pregnant      Passed - Last BP in normal range    BP Readings from Last 1 Encounters:  12/31/19 (!) 119/56

## 2020-07-10 NOTE — Telephone Encounter (Signed)
Lvm to make apt X2

## 2020-07-12 NOTE — Telephone Encounter (Signed)
Lvm to make apt.  

## 2020-08-02 DIAGNOSIS — Z03818 Encounter for observation for suspected exposure to other biological agents ruled out: Secondary | ICD-10-CM | POA: Diagnosis not present

## 2020-08-02 DIAGNOSIS — Z20822 Contact with and (suspected) exposure to covid-19: Secondary | ICD-10-CM | POA: Diagnosis not present

## 2020-09-04 ENCOUNTER — Other Ambulatory Visit: Payer: Self-pay | Admitting: Nurse Practitioner

## 2020-09-05 NOTE — Progress Notes (Signed)
BP 105/70   Pulse 76   Ht '5\' 2"'  (1.575 m)   Wt 192 lb (87.1 kg)   LMP  (LMP Unknown)   SpO2 94%   BMI 35.12 kg/m    Subjective:    Patient ID: Alyssa Bryant, female    DOB: 01/04/1951, 70 y.o.   MRN: [id number]  HPI: Alyssa Bryant is a 70 y.o. female  Chief Complaint  Patient presents with  . Hyperlipidemia  . Hypertension  . Hypothyroidism   HYPERTENSION / HYPERLIPIDEMIA Satisfied with current treatment? yes Duration of hypertension: years BP monitoring frequency: a few times a month BP range: Not sure what it runs BP medication side effects: no Past BP meds: lisinopril-HCTZ Duration of hyperlipidemia: years Cholesterol medication side effects: No Cholesterol supplements: none Past cholesterol medications: atorvastain (lipitor) Medication compliance: excellent compliance Aspirin: yes Recent stressors: no Recurrent headaches: no Visual changes: no Palpitations: no Dyspnea: no Chest pain: no Lower extremity edema: no Dizzy/lightheaded: no  HYPOTHYROIDISM Thyroid control status:stable Satisfied with current treatment? yes Medication side effects: no Medication compliance: excellent compliance Etiology of hypothyroidism:  Recent dose adjustment:no Fatigue: no Cold intolerance: no Heat intolerance: no Weight gain: no Weight loss: no Constipation: no Diarrhea/loose stools: no Palpitations: no Lower extremity edema: no Anxiety/depressed mood: no     Relevant past medical, surgical, family and social history reviewed and updated as indicated. Interim medical history since our last visit reviewed. Allergies and medications reviewed and updated.  Review of Systems  Constitutional: Negative for fever and unexpected weight change.  Eyes: Negative for visual disturbance.  Respiratory: Negative for cough, chest tightness and shortness of breath.   Cardiovascular: Negative for chest pain, palpitations and leg swelling.  Endocrine: Negative for cold intolerance.   Neurological: Negative for dizziness and headaches.    Per HPI unless specifically indicated above     Objective:    BP 105/70   Pulse 76   Ht '5\' 2"'  (1.575 m)   Wt 192 lb (87.1 kg)   LMP  (LMP Unknown)   SpO2 94%   BMI 35.12 kg/m   Wt Readings from Last 3 Encounters:  09/06/20 192 lb (87.1 kg)  12/31/19 180 lb (81.6 kg)  11/18/19 196 lb 6 oz (89.1 kg)    Physical Exam  Results for orders placed or performed during the hospital encounter of 40/98/11  Basic metabolic panel  Result Value Ref Range   Sodium 140 135 - 145 mmol/L   Potassium 3.7 3.5 - 5.1 mmol/L   Chloride 106 98 - 111 mmol/L   CO2 24 22 - 32 mmol/L   Glucose, Bld 100 (H) 70 - 99 mg/dL   BUN 39 (H) 8 - 23 mg/dL   Creatinine, Ser 1.11 (H) 0.44 - 1.00 mg/dL   Calcium 9.1 8.9 - 10.3 mg/dL   GFR calc non Af Amer 50 (L) >60 mL/min   GFR calc Af Amer 58 (L) >60 mL/min   Anion gap 10 5 - 15  CBC with Differential  Result Value Ref Range   WBC 7.9 4.0 - 10.5 K/uL   RBC 4.53 3.87 - 5.11 MIL/uL   Hemoglobin 13.4 12.0 - 15.0 g/dL   HCT 40.1 36.0 - 46.0 %   MCV 88.5 80.0 - 100.0 fL   MCH 29.6 26.0 - 34.0 pg   MCHC 33.4 30.0 - 36.0 g/dL   RDW 13.7 11.5 - 15.5 %   Platelets 340 150 - 400 K/uL   nRBC 0.0 0.0 - 0.2 %  Neutrophils Relative % 75 %   Neutro Abs 6.0 1.7 - 7.7 K/uL   Lymphocytes Relative 16 %   Lymphs Abs 1.2 0.7 - 4.0 K/uL   Monocytes Relative 7 %   Monocytes Absolute 0.6 0.1 - 1.0 K/uL   Eosinophils Relative 0 %   Eosinophils Absolute 0.0 0.0 - 0.5 K/uL   Basophils Relative 1 %   Basophils Absolute 0.0 0.0 - 0.1 K/uL   Immature Granulocytes 1 %   Abs Immature Granulocytes 0.06 0.00 - 0.07 K/uL  Troponin I (High Sensitivity)  Result Value Ref Range   Troponin I (High Sensitivity) 10 <18 ng/L  Troponin I (High Sensitivity)  Result Value Ref Range   Troponin I (High Sensitivity) 9 <18 ng/L      Assessment & Plan:   Problem List Items Addressed This Visit      Cardiovascular and  Mediastinum   Hypertension - Primary    BPs stable and WNL, continue current regimen.  Labs ordered today.  Refills sent.      Relevant Medications   atorvastatin (LIPITOR) 20 MG tablet   lisinopril-hydrochlorothiazide (ZESTORETIC) 20-12.5 MG tablet   Other Relevant Orders   Comp Met (CMET)     Endocrine   Hypothyroidism    Recheck TSH, adjust dose if needed. Continue current regimen.  Labs ordered today.  Refills sent.      Relevant Medications   levothyroxine (SYNTHROID) 100 MCG tablet   Other Relevant Orders   Lipid Profile   T4, free     Other   Mixed hyperlipidemia    Recheck lipids, adjust as needed. Continue current regimen.  Refills sent today.       Relevant Medications   atorvastatin (LIPITOR) 20 MG tablet   lisinopril-hydrochlorothiazide (ZESTORETIC) 20-12.5 MG tablet   Other Relevant Orders   TSH   Vitamin D deficiency    Stable.  Labs ordered today.  Will make recommendations based on lab results.       Relevant Orders   Vitamin D (25 hydroxy)    Other Visit Diagnoses    Encounter for screening mammogram for malignant neoplasm of breast       Relevant Orders   MM Digital Screening       Follow up plan: Return in about 6 months (around 03/09/2021) for Physical and Fasting labs.

## 2020-09-06 ENCOUNTER — Ambulatory Visit (INDEPENDENT_AMBULATORY_CARE_PROVIDER_SITE_OTHER): Payer: Medicare Other | Admitting: Nurse Practitioner

## 2020-09-06 ENCOUNTER — Encounter: Payer: Self-pay | Admitting: Nurse Practitioner

## 2020-09-06 ENCOUNTER — Other Ambulatory Visit: Payer: Self-pay

## 2020-09-06 VITALS — BP 105/70 | HR 76 | Ht 62.0 in | Wt 192.0 lb

## 2020-09-06 DIAGNOSIS — Z1231 Encounter for screening mammogram for malignant neoplasm of breast: Secondary | ICD-10-CM | POA: Diagnosis not present

## 2020-09-06 DIAGNOSIS — E038 Other specified hypothyroidism: Secondary | ICD-10-CM

## 2020-09-06 DIAGNOSIS — E782 Mixed hyperlipidemia: Secondary | ICD-10-CM | POA: Diagnosis not present

## 2020-09-06 DIAGNOSIS — I10 Essential (primary) hypertension: Secondary | ICD-10-CM | POA: Diagnosis not present

## 2020-09-06 DIAGNOSIS — E559 Vitamin D deficiency, unspecified: Secondary | ICD-10-CM

## 2020-09-06 MED ORDER — LISINOPRIL-HYDROCHLOROTHIAZIDE 20-12.5 MG PO TABS: 2.0000 | ORAL_TABLET | Freq: Every day | ORAL | 1 refills | Status: AC

## 2020-09-06 MED ORDER — ATORVASTATIN CALCIUM 20 MG PO TABS: 20.0000 mg | ORAL_TABLET | Freq: Every day | ORAL | 1 refills | Status: DC

## 2020-09-06 MED ORDER — LEVOTHYROXINE SODIUM 100 MCG PO TABS: 100.0000 ug | ORAL_TABLET | Freq: Every day | ORAL | 1 refills | Status: DC

## 2020-09-06 NOTE — Assessment & Plan Note (Signed)
Recheck lipids, adjust as needed. Continue current regimen.  Refills sent today.

## 2020-09-06 NOTE — Assessment & Plan Note (Signed)
BPs stable and WNL, continue current regimen.  Labs ordered today.  Refills sent.

## 2020-09-06 NOTE — Assessment & Plan Note (Signed)
Stable.  Labs ordered today.  Will make recommendations based on lab results.

## 2020-09-06 NOTE — Assessment & Plan Note (Signed)
Recheck TSH, adjust dose if needed. Continue current regimen.  Labs ordered today.  Refills sent.

## 2020-09-07 ENCOUNTER — Telehealth: Payer: Self-pay

## 2020-09-07 LAB — COMPREHENSIVE METABOLIC PANEL
ALT: 16 IU/L (ref 0–32)
AST: 18 IU/L (ref 0–40)
Albumin/Globulin Ratio: 1.5 (ref 1.2–2.2)
Albumin: 4.4 g/dL (ref 3.7–4.7)
Alkaline Phosphatase: 103 IU/L (ref 44–121)
BUN/Creatinine Ratio: 32 — ABNORMAL HIGH (ref 12–28)
BUN: 43 mg/dL — ABNORMAL HIGH (ref 8–27)
Bilirubin Total: 0.2 mg/dL (ref 0.0–1.2)
CO2: 19 mmol/L — ABNORMAL LOW (ref 20–29)
Calcium: 9.7 mg/dL (ref 8.7–10.3)
Chloride: 101 mmol/L (ref 96–106)
Creatinine, Ser: 1.36 mg/dL — ABNORMAL HIGH (ref 0.57–1.00)
Globulin, Total: 2.9 g/dL (ref 1.5–4.5)
Glucose: 103 mg/dL — ABNORMAL HIGH (ref 65–99)
Potassium: 4.4 mmol/L (ref 3.5–5.2)
Sodium: 137 mmol/L (ref 134–144)
Total Protein: 7.3 g/dL (ref 6.0–8.5)
eGFR: 42 mL/min/{1.73_m2} — ABNORMAL LOW (ref >59)

## 2020-09-07 LAB — T4, FREE: Free T4: 1.32 ng/dL (ref 0.82–1.77)

## 2020-09-07 LAB — LIPID PANEL
Chol/HDL Ratio: 4.1 ratio (ref 0.0–4.4)
Cholesterol, Total: 138 mg/dL (ref 100–199)
HDL: 34 mg/dL — ABNORMAL LOW (ref >39)
LDL Chol Calc (NIH): 52 mg/dL (ref 0–99)
Triglycerides: 337 mg/dL — ABNORMAL HIGH (ref 0–149)
VLDL Cholesterol Cal: 52 mg/dL — ABNORMAL HIGH (ref 5–40)

## 2020-09-07 LAB — TSH: TSH: 1.24 u[IU]/mL (ref 0.450–4.500)

## 2020-09-07 LAB — VITAMIN D 25 HYDROXY (VIT D DEFICIENCY, FRACTURES): Vit D, 25-Hydroxy: 21.5 ng/mL — ABNORMAL LOW (ref 30.0–100.0)

## 2020-09-07 NOTE — Telephone Encounter (Signed)
Copied from [place] [phone]. Topic: General - Other >> Sep 07, 2020  2:02 PM Tessa Lerner A wrote: Reason for CRM: Patient called to further discuss lab results Patient would like for husband to be present for conversation as well Please contact to advise

## 2020-09-07 NOTE — Progress Notes (Signed)
Please let the patient know that her lab work is consistent with chronic kidney disease.  This is consistent with her lab work in the past.  At this time, no medication changes need to be made.  Please make sure she avoids NSAIDS such as motrin, aleve or ibuprofen. These can be hard on the kidneys and we want to avoid that.  Triglycerides are elevated.  If the patient wasn't fasting this is would explain the large jump.  Please come in fasting at next appointment so we can recheck this. In the mean time, make sure to decrease processed food in take.   Thyroid labs are within normal range.  Continue current dose of thyroid medication.    Vitamin D is low.  I recommend patient take Vitamind D 1000 IU daily.  This is over the counter.  We will check this at future visits.   Otherwise, continue current medication regimen.  I will see Alyssa Bryant in 6 months.

## 2020-09-08 NOTE — Telephone Encounter (Signed)
Called patient to discuss lab results, no answer, left a voicemail for patient to return my call.  ?  ? ? ?

## 2020-09-08 NOTE — Telephone Encounter (Signed)
Pt is calling back to discuss further her lab results

## 2020-09-11 NOTE — Telephone Encounter (Signed)
Labs explained to patient.

## 2020-11-02 DIAGNOSIS — I10 Essential (primary) hypertension: Secondary | ICD-10-CM | POA: Diagnosis not present

## 2020-11-02 DIAGNOSIS — E782 Mixed hyperlipidemia: Secondary | ICD-10-CM | POA: Diagnosis not present

## 2020-11-02 DIAGNOSIS — Z780 Asymptomatic menopausal state: Secondary | ICD-10-CM | POA: Diagnosis not present

## 2020-11-02 DIAGNOSIS — N170 Acute kidney failure with tubular necrosis: Secondary | ICD-10-CM | POA: Diagnosis not present

## 2020-11-02 DIAGNOSIS — Z1231 Encounter for screening mammogram for malignant neoplasm of breast: Secondary | ICD-10-CM | POA: Diagnosis not present

## 2020-11-02 DIAGNOSIS — E039 Hypothyroidism, unspecified: Secondary | ICD-10-CM | POA: Diagnosis not present

## 2020-11-06 ENCOUNTER — Other Ambulatory Visit: Payer: Self-pay | Admitting: Internal Medicine

## 2020-11-06 DIAGNOSIS — Z1231 Encounter for screening mammogram for malignant neoplasm of breast: Secondary | ICD-10-CM

## 2020-11-06 DIAGNOSIS — N170 Acute kidney failure with tubular necrosis: Secondary | ICD-10-CM

## 2020-11-16 DIAGNOSIS — M8588 Other specified disorders of bone density and structure, other site: Secondary | ICD-10-CM | POA: Diagnosis not present

## 2020-12-18 ENCOUNTER — Other Ambulatory Visit: Payer: Self-pay

## 2020-12-18 ENCOUNTER — Ambulatory Visit
Admission: RE | Admit: 2020-12-18 | Discharge: 2020-12-18 | Disposition: A | Discharge status: Home / Self Care | Payer: Medicare Other | Source: Ambulatory Visit | Attending: Internal Medicine | Admitting: Internal Medicine

## 2020-12-18 DIAGNOSIS — N179 Acute kidney failure, unspecified: Secondary | ICD-10-CM | POA: Diagnosis not present

## 2020-12-18 DIAGNOSIS — N170 Acute kidney failure with tubular necrosis: Secondary | ICD-10-CM | POA: Insufficient documentation

## 2020-12-19 DIAGNOSIS — Z20822 Contact with and (suspected) exposure to covid-19: Secondary | ICD-10-CM | POA: Diagnosis not present

## 2020-12-19 DIAGNOSIS — Z03818 Encounter for observation for suspected exposure to other biological agents ruled out: Secondary | ICD-10-CM | POA: Diagnosis not present

## 2021-02-25 ENCOUNTER — Other Ambulatory Visit: Payer: Self-pay | Admitting: Nurse Practitioner

## 2021-02-25 NOTE — Telephone Encounter (Signed)
Requested Prescriptions  Pending Prescriptions Disp Refills  . atorvastatin (LIPITOR) 20 MG tablet [Pharmacy Med Name: Atorvastatin Calcium 20 MG Oral Tablet] 90 tablet 0    Sig: TAKE 1 TABLET BY MOUTH ONCE DAILY . APPOINTMENT REQUIRED FOR FUTURE REFILLS     Cardiovascular:  Antilipid - Statins Failed - 02/25/2021  3:48 PM      Failed - HDL in normal range and within 360 days    HDL  Date Value Ref Range Status  09/06/2020 34 (L) >39 mg/dL Final         Failed - Triglycerides in normal range and within 360 days    Triglycerides  Date Value Ref Range Status  09/06/2020 337 (H) 0 - 149 mg/dL Final         Passed - Total Cholesterol in normal range and within 360 days    Cholesterol, Total  Date Value Ref Range Status  09/06/2020 138 100 - 199 mg/dL Final         Passed - LDL in normal range and within 360 days    LDL Chol Calc (NIH)  Date Value Ref Range Status  09/06/2020 52 0 - 99 mg/dL Final         Passed - Patient is not pregnant      Passed - Valid encounter within last 12 months    Recent Outpatient Visits          5 months ago Primary hypertension   [place] Jon Billings, NP   1 year ago Bilateral hand pain   [facility] [name], [place]   1 year ago Acute right-sided low back pain without sciatica   [place], Megan P, DO   1 year ago Bilateral hand pain   [facility] [place], [place], DO   1 year ago Dizziness   [facility] [place], Megan P, DO             . EUTHYROX 100 MCG tablet [Pharmacy Med Name: Euthyrox 100 MCG Oral Tablet] 90 tablet 0    Sig: Take 1 tablet by mouth once daily     Endocrinology:  Hypothyroid Agents Failed - 02/25/2021  3:48 PM      Failed - TSH needs to be rechecked within 3 months after an abnormal result. Refill until TSH is due.      Passed - TSH in normal range and within 360 days    TSH  Date Value Ref Range Status  09/06/2020  1.240 0.450 - 4.500 uIU/mL Final         Passed - Valid encounter within last 12 months    Recent Outpatient Visits          5 months ago Primary hypertension   [facility] Jon Billings, NP   1 year ago Bilateral hand pain   [facility] [name], [place]   1 year ago Acute right-sided low back pain without sciatica   [place], [place], DO   1 year ago Bilateral hand pain   [place], [place], DO   1 year ago Dizziness   [place], [place], DO

## 2021-04-19 ENCOUNTER — Telehealth: Payer: Self-pay | Admitting: Nurse Practitioner

## 2021-04-19 NOTE — Telephone Encounter (Signed)
Copied from [place] [id number]. Topic: Medicare AWV >> Apr 19, 2021  2:39 PM Lavonia Drafts wrote: Reason for CRM: Left message for patient to call back and schedule the Medicare Annual Wellness Visit (AWV) virtually or by telephone.  Last AWV 12/08/18  Please schedule at anytime with [facility] Advisor.  45 minute appointment  Any questions, please call me at [phone]

## 2021-04-20 NOTE — Telephone Encounter (Signed)
Pt states that she would not like to be seen at this office any longer and is requesting to not receive any more calls. Please advise.

## 2021-04-27 NOTE — Telephone Encounter (Signed)
Spoke with patient and she advised that she has switched her PCP to Dr. Tressia Miners at [facility].

## 2021-04-27 NOTE — Telephone Encounter (Signed)
LMOM for patient to call to confirm she no longer wishes to be seen at [facility].

## 2021-04-30 DIAGNOSIS — N170 Acute kidney failure with tubular necrosis: Secondary | ICD-10-CM | POA: Diagnosis not present

## 2021-04-30 DIAGNOSIS — E782 Mixed hyperlipidemia: Secondary | ICD-10-CM | POA: Diagnosis not present

## 2021-04-30 DIAGNOSIS — R7309 Other abnormal glucose: Secondary | ICD-10-CM | POA: Diagnosis not present

## 2021-04-30 DIAGNOSIS — Z780 Asymptomatic menopausal state: Secondary | ICD-10-CM | POA: Diagnosis not present

## 2021-04-30 DIAGNOSIS — M8580 Other specified disorders of bone density and structure, unspecified site: Secondary | ICD-10-CM | POA: Diagnosis not present

## 2021-05-07 DIAGNOSIS — I129 Hypertensive chronic kidney disease with stage 1 through stage 4 chronic kidney disease, or unspecified chronic kidney disease: Secondary | ICD-10-CM | POA: Diagnosis not present

## 2021-05-07 DIAGNOSIS — E782 Mixed hyperlipidemia: Secondary | ICD-10-CM | POA: Diagnosis not present

## 2021-05-07 DIAGNOSIS — Z780 Asymptomatic menopausal state: Secondary | ICD-10-CM | POA: Diagnosis not present

## 2021-05-07 DIAGNOSIS — Z23 Encounter for immunization: Secondary | ICD-10-CM | POA: Diagnosis not present

## 2021-05-07 DIAGNOSIS — E039 Hypothyroidism, unspecified: Secondary | ICD-10-CM | POA: Diagnosis not present

## 2021-05-07 DIAGNOSIS — Z0000 Encounter for general adult medical examination without abnormal findings: Secondary | ICD-10-CM | POA: Diagnosis not present

## 2021-05-07 DIAGNOSIS — N1831 Chronic kidney disease, stage 3a: Secondary | ICD-10-CM | POA: Diagnosis not present

## 2021-05-07 DIAGNOSIS — Z1231 Encounter for screening mammogram for malignant neoplasm of breast: Secondary | ICD-10-CM | POA: Diagnosis not present

## 2021-09-11 DIAGNOSIS — B078 Other viral warts: Secondary | ICD-10-CM | POA: Diagnosis not present

## 2021-09-11 DIAGNOSIS — D485 Neoplasm of uncertain behavior of skin: Secondary | ICD-10-CM | POA: Diagnosis not present

## 2021-09-11 DIAGNOSIS — C44311 Basal cell carcinoma of skin of nose: Secondary | ICD-10-CM | POA: Diagnosis not present

## 2021-10-02 DIAGNOSIS — B078 Other viral warts: Secondary | ICD-10-CM | POA: Diagnosis not present

## 2021-10-23 DIAGNOSIS — B078 Other viral warts: Secondary | ICD-10-CM | POA: Diagnosis not present

## 2021-11-02 DIAGNOSIS — R7303 Prediabetes: Secondary | ICD-10-CM | POA: Diagnosis not present

## 2021-11-02 DIAGNOSIS — E782 Mixed hyperlipidemia: Secondary | ICD-10-CM | POA: Diagnosis not present

## 2021-11-05 DIAGNOSIS — E782 Mixed hyperlipidemia: Secondary | ICD-10-CM | POA: Diagnosis not present

## 2021-11-05 DIAGNOSIS — E039 Hypothyroidism, unspecified: Secondary | ICD-10-CM | POA: Diagnosis not present

## 2021-11-05 DIAGNOSIS — M7551 Bursitis of right shoulder: Secondary | ICD-10-CM | POA: Diagnosis not present

## 2021-11-05 DIAGNOSIS — R7303 Prediabetes: Secondary | ICD-10-CM | POA: Diagnosis not present

## 2021-11-14 ENCOUNTER — Telehealth: Payer: Self-pay

## 2021-11-14 NOTE — Telephone Encounter (Signed)
Called patient because she is coming up on reports for our office. Patient has not been seen here in over a year and looks as if she has established care with Dr. Tressia Miners at [facility]. LVM asking for patient to please return my call.  ? ?OK for PEC to confirm the above information with the patient if she calls back.  ?

## 2021-11-15 NOTE — Telephone Encounter (Signed)
Called and LVM asking for patient to please return my call.  

## 2021-11-16 NOTE — Telephone Encounter (Signed)
Called and LVM asking for patient to please return my call.  

## 2021-11-29 DIAGNOSIS — C44311 Basal cell carcinoma of skin of nose: Secondary | ICD-10-CM | POA: Diagnosis not present

## 2021-12-11 DIAGNOSIS — R197 Diarrhea, unspecified: Secondary | ICD-10-CM | POA: Diagnosis not present

## 2021-12-11 DIAGNOSIS — R110 Nausea: Secondary | ICD-10-CM | POA: Diagnosis not present

## 2021-12-12 DIAGNOSIS — R110 Nausea: Secondary | ICD-10-CM | POA: Diagnosis not present

## 2021-12-12 DIAGNOSIS — R197 Diarrhea, unspecified: Secondary | ICD-10-CM | POA: Diagnosis not present

## 2022-05-01 DIAGNOSIS — E782 Mixed hyperlipidemia: Secondary | ICD-10-CM | POA: Diagnosis not present

## 2022-05-01 DIAGNOSIS — Z780 Asymptomatic menopausal state: Secondary | ICD-10-CM | POA: Diagnosis not present

## 2022-05-01 DIAGNOSIS — E039 Hypothyroidism, unspecified: Secondary | ICD-10-CM | POA: Diagnosis not present

## 2022-05-01 DIAGNOSIS — R7303 Prediabetes: Secondary | ICD-10-CM | POA: Diagnosis not present

## 2022-05-08 DIAGNOSIS — Z0000 Encounter for general adult medical examination without abnormal findings: Secondary | ICD-10-CM | POA: Diagnosis not present

## 2022-05-08 DIAGNOSIS — N1831 Chronic kidney disease, stage 3a: Secondary | ICD-10-CM | POA: Diagnosis not present

## 2022-05-08 DIAGNOSIS — E782 Mixed hyperlipidemia: Secondary | ICD-10-CM | POA: Diagnosis not present

## 2022-05-08 DIAGNOSIS — I129 Hypertensive chronic kidney disease with stage 1 through stage 4 chronic kidney disease, or unspecified chronic kidney disease: Secondary | ICD-10-CM | POA: Diagnosis not present

## 2022-05-08 DIAGNOSIS — R7303 Prediabetes: Secondary | ICD-10-CM | POA: Diagnosis not present

## 2022-05-08 DIAGNOSIS — Z1231 Encounter for screening mammogram for malignant neoplasm of breast: Secondary | ICD-10-CM | POA: Diagnosis not present

## 2022-05-08 DIAGNOSIS — E039 Hypothyroidism, unspecified: Secondary | ICD-10-CM | POA: Diagnosis not present

## 2022-05-08 DIAGNOSIS — Z1389 Encounter for screening for other disorder: Secondary | ICD-10-CM | POA: Diagnosis not present

## 2022-05-09 ENCOUNTER — Other Ambulatory Visit: Payer: Self-pay | Admitting: Internal Medicine

## 2022-05-09 DIAGNOSIS — Z1231 Encounter for screening mammogram for malignant neoplasm of breast: Secondary | ICD-10-CM

## 2022-05-14 DIAGNOSIS — C44311 Basal cell carcinoma of skin of nose: Secondary | ICD-10-CM | POA: Diagnosis not present

## 2022-06-27 ENCOUNTER — Ambulatory Visit
Admission: EM | Admit: 2022-06-27 | Discharge: 2022-06-27 | Disposition: A | Discharge status: Home / Self Care | Payer: Medicare Other | Attending: Emergency Medicine | Admitting: Emergency Medicine

## 2022-06-27 DIAGNOSIS — J069 Acute upper respiratory infection, unspecified: Secondary | ICD-10-CM | POA: Diagnosis not present

## 2022-06-27 DIAGNOSIS — J029 Acute pharyngitis, unspecified: Secondary | ICD-10-CM | POA: Diagnosis not present

## 2022-06-27 DIAGNOSIS — Z1152 Encounter for screening for COVID-19: Secondary | ICD-10-CM | POA: Insufficient documentation

## 2022-06-27 DIAGNOSIS — Z79899 Other long term (current) drug therapy: Secondary | ICD-10-CM | POA: Diagnosis not present

## 2022-06-27 DIAGNOSIS — R058 Other specified cough: Secondary | ICD-10-CM | POA: Insufficient documentation

## 2022-06-27 LAB — SARS CORONAVIRUS 2 BY RT PCR: SARS Coronavirus 2 by RT PCR: NEGATIVE

## 2022-06-27 LAB — GROUP A STREP BY PCR: Group A Strep by PCR: NOT DETECTED

## 2022-06-27 MED ORDER — PROMETHAZINE-DM 6.25-15 MG/5ML PO SYRP: 5.0000 mL | ORAL_SOLUTION | Freq: Four times a day (QID) | ORAL | 0 refills | Status: AC | PRN

## 2022-06-27 MED ORDER — IPRATROPIUM BROMIDE 0.06 % NA SOLN: 2.0000 | Freq: Four times a day (QID) | NASAL | 12 refills | Status: AC

## 2022-06-27 MED ORDER — BENZONATATE 100 MG PO CAPS: 200.0000 mg | ORAL_CAPSULE | Freq: Three times a day (TID) | ORAL | 0 refills | Status: AC

## 2022-06-27 NOTE — ED Provider Notes (Signed)
[facility]    CSN: [id number] Arrival date & time: 06/27/22  1029      History   Chief Complaint Chief Complaint  Patient presents with   Cough   Congestion   Sore Throat         HPI Alyssa Bryant is a 71 y.o. female.   HPI  71 year old female here for evaluation of respiratory complaints.  Patient reports that for the last 2 days she has been experiencing nasal congestion with clear nasal discharge, sore throat, body aches, and a nonproductive cough.  She has not had fever, ear pain, shortness of breath, or wheezing.  Her husband has had similar symptoms.  Past Medical History:  Diagnosis Date   Arthritis    back   Dyspnea    Hyperlipidemia    Hypertension    Hypothyroidism    Thyroid disease     Patient Active Problem List   Diagnosis Date Noted   Vitamin D deficiency 08/16/2019   Dizziness 08/12/2019   Bilateral hand pain 03/12/2018   Advanced care planning/counseling discussion 09/03/2016   Plantar fasciitis 07/10/2016   Osteopenia 09/05/2015   Mixed hyperlipidemia 09/05/2015   Hypothyroidism 09/05/2015   Hypertension 09/05/2015    Past Surgical History:  Procedure Laterality Date   ABDOMINAL HYSTERECTOMY  1978   partial   COLONOSCOPY WITH PROPOFOL N/A 11/03/2017   Procedure: COLONOSCOPY WITH PROPOFOL;  Surgeon: [name], MD;  Location: [place];  Service: Endoscopy;  Laterality: N/A;    OB History   No obstetric history on file.      Home Medications    Prior to Admission medications   Medication Sig Start Date End Date Taking? Authorizing Provider  ASPIRIN 81 PO Take by mouth daily.   Yes [provider]  atorvastatin (LIPITOR) 20 MG tablet TAKE 1 TABLET BY MOUTH ONCE DAILY . APPOINTMENT REQUIRED FOR FUTURE REFILLS 02/25/21  Yes Jon Billings, NP  benzonatate (TESSALON) 100 MG capsule Take 2 capsules (200 mg total) by mouth every 8 (eight) hours. 06/27/22  Yes Margarette Canada, NP  EUTHYROX 100 MCG  tablet Take 1 tablet by mouth once daily 02/25/21  Yes Jon Billings, NP  ipratropium (ATROVENT) 0.06 % nasal spray Place 2 sprays into both nostrils 4 (four) times daily. 06/27/22  Yes Margarette Canada, NP  lisinopril-hydrochlorothiazide (ZESTORETIC) 20-12.5 MG tablet Take 2 tablets by mouth daily. Need office visit for further refills. 09/06/20  Yes Jon Billings, NP  promethazine-dextromethorphan (PROMETHAZINE-DM) 6.25-15 MG/5ML syrup Take 5 mLs by mouth 4 (four) times daily as needed. 06/27/22  Yes Margarette Canada, NP    Family History Family History  Problem Relation Age of Onset   Hyperlipidemia Mother    Hyperthyroidism Mother    Cancer Father        lymphoma   Hypertension Father    Stroke Sister    Hypertension Son    Thyroid disease Son    Hypertension Sister     Social History Social History   Tobacco Use   Smoking status: Never   Smokeless tobacco: Never  Vaping Use   Vaping Use: Never used  Substance Use Topics   Alcohol use: No    Alcohol/week: 0.0 standard drinks of alcohol   Drug use: No     Allergies   Sulfa antibiotics   Review of Systems Review of Systems  Constitutional:  Negative for fever.  HENT:  Positive for congestion, rhinorrhea and sore throat. Negative for ear pain.   Respiratory:  Positive for cough. Negative for shortness of breath and wheezing.   Musculoskeletal:  Positive for arthralgias and myalgias.  Hematological: Negative.   Psychiatric/Behavioral: Negative.       Physical Exam Triage Vital Signs ED Triage Vitals  Enc Vitals Group     BP 06/27/22 1208 (!) 143/67     Pulse Rate 06/27/22 1208 69     Resp 06/27/22 1208 16     Temp 06/27/22 1208 98.2 F (36.8 C)     Temp Source 06/27/22 1208 Oral     SpO2 06/27/22 1208 94 %     Weight 06/27/22 1207 170 lb (77.1 kg)     Height 06/27/22 1207 '5\' 2"'$  (1.575 m)     Head Circumference --      Peak Flow --      Pain Score 06/27/22 1206 10     Pain Loc --      Pain Edu? --       Excl. in [place]? --    No data found.  Updated Vital Signs BP (!) 143/67 (BP Location: Left Arm)   Pulse 69   Temp 98.2 F (36.8 C) (Oral)   Resp 16   Ht '5\' 2"'$  (1.575 m)   Wt 170 lb (77.1 kg)   LMP  (LMP Unknown)   SpO2 94%   BMI 31.09 kg/m   Visual Acuity Right Eye Distance:   Left Eye Distance:   Bilateral Distance:    Right Eye Near:   Left Eye Near:    Bilateral Near:     Physical Exam Vitals and nursing note reviewed.  Constitutional:      Appearance: Normal appearance. She is not ill-appearing.  HENT:     Head: Normocephalic and atraumatic.     Right Ear: Tympanic membrane, ear canal and external ear normal. There is no impacted cerumen.     Left Ear: Tympanic membrane, ear canal and external ear normal. There is no impacted cerumen.     Nose: Congestion and rhinorrhea present.     Comments: Nasal mucosa is erythematous and edematous with clear rhinorrhea in both nares.    Mouth/Throat:     Mouth: Mucous membranes are moist.     Pharynx: Oropharynx is clear. Posterior oropharyngeal erythema present. No oropharyngeal exudate.     Comments: Posterior oropharynx has erythema and injection with clear postnasal drip. Cardiovascular:     Rate and Rhythm: Normal rate and regular rhythm.     Pulses: Normal pulses.     Heart sounds: Normal heart sounds. No murmur heard.    No friction rub. No gallop.  Pulmonary:     Effort: Pulmonary effort is normal.     Breath sounds: Normal breath sounds. No wheezing, rhonchi or rales.  Musculoskeletal:     Cervical back: Normal range of motion and neck supple.  Lymphadenopathy:     Cervical: No cervical adenopathy.  Skin:    General: Skin is warm and dry.     Capillary Refill: Capillary refill takes less than 2 seconds.  Neurological:     General: No focal deficit present.     Mental Status: She is alert and oriented to person, place, and time.      UC Treatments / Results  Labs (all labs ordered are listed, but only  abnormal results are displayed) Labs Reviewed  GROUP A STREP BY PCR  SARS CORONAVIRUS 2 BY RT PCR    EKG   Radiology No results found.  Procedures Procedures (including  critical care time)  Medications Ordered in UC Medications - No data to display  Initial Impression / Assessment and Plan / UC Course  I have reviewed the triage vital signs and the nursing notes.  Pertinent labs & imaging results that were available during my care of the patient were reviewed by me and considered in my medical decision making (see chart for details).   Patient is a pleasant, nontoxic-appearing 22 old female here for evaluation Rester complaints outlined in HPI above.  Her exam does reveal inflammation and of her upper respiratory tree is consistent with an upper respiratory infection.  I will check COVID PCR and strep PCR given the posterior oropharyngeal erythema and patient sore throat pain.  Strep PCR is negative.  COVID PCR is negative.  I will discharge patient on the diagnosis of viral URI with cough and prescribe Tessalon Perles to help with cough during the day, Atrovent nasal spray to help with nasal congestion and postnasal drip, and Promethazine DM cough so that she can use at bedtime.  Return precautions reviewed.   Final Clinical Impressions(s) / UC Diagnoses   Final diagnoses:  Viral URI with cough     Discharge Instructions      Use the Atrovent nasal spray, 2 squirts in each nostril every 6 hours, as needed for runny nose and postnasal drip.  Use the Tessalon Perles every 8 hours during the day.  Take them with a small sip of water.  They may give you some numbness to the base of your tongue or a metallic taste in your mouth, this is normal.  Use the Promethazine DM cough syrup at bedtime for cough and congestion.  It will make you drowsy so do not take it during the day.  Return for reevaluation or see your primary care provider for any new or worsening symptoms.       ED Prescriptions     Medication Sig Dispense Auth. Provider   benzonatate (TESSALON) 100 MG capsule Take 2 capsules (200 mg total) by mouth every 8 (eight) hours. 21 capsule Margarette Canada, NP   ipratropium (ATROVENT) 0.06 % nasal spray Place 2 sprays into both nostrils 4 (four) times daily. 15 mL Margarette Canada, NP   promethazine-dextromethorphan (PROMETHAZINE-DM) 6.25-15 MG/5ML syrup Take 5 mLs by mouth 4 (four) times daily as needed. 118 mL Margarette Canada, NP      PDMP not reviewed this encounter.   Margarette Canada, NP 06/27/22 1301

## 2022-06-27 NOTE — ED Triage Notes (Signed)
Pt c/o cough,sore throat,congestion & muscle aches x2 days. Otc cough med,aleve & tylenol w/minor relief. Has been around sick husband.

## 2022-06-27 NOTE — Discharge Instructions (Signed)

## 2022-07-10 DIAGNOSIS — J208 Acute bronchitis due to other specified organisms: Secondary | ICD-10-CM | POA: Diagnosis not present

## 2022-07-10 DIAGNOSIS — E039 Hypothyroidism, unspecified: Secondary | ICD-10-CM | POA: Diagnosis not present

## 2022-07-17 DIAGNOSIS — L570 Actinic keratosis: Secondary | ICD-10-CM | POA: Diagnosis not present

## 2022-07-17 DIAGNOSIS — C44311 Basal cell carcinoma of skin of nose: Secondary | ICD-10-CM | POA: Diagnosis not present

## 2022-07-17 DIAGNOSIS — B078 Other viral warts: Secondary | ICD-10-CM | POA: Diagnosis not present

## 2022-07-17 DIAGNOSIS — D2261 Melanocytic nevi of right upper limb, including shoulder: Secondary | ICD-10-CM | POA: Diagnosis not present

## 2022-07-17 DIAGNOSIS — L820 Inflamed seborrheic keratosis: Secondary | ICD-10-CM | POA: Diagnosis not present

## 2022-07-17 DIAGNOSIS — D485 Neoplasm of uncertain behavior of skin: Secondary | ICD-10-CM | POA: Diagnosis not present

## 2022-07-17 DIAGNOSIS — D225 Melanocytic nevi of trunk: Secondary | ICD-10-CM | POA: Diagnosis not present

## 2022-07-17 DIAGNOSIS — D2262 Melanocytic nevi of left upper limb, including shoulder: Secondary | ICD-10-CM | POA: Diagnosis not present

## 2022-07-17 DIAGNOSIS — C44519 Basal cell carcinoma of skin of other part of trunk: Secondary | ICD-10-CM | POA: Diagnosis not present

## 2022-07-24 DIAGNOSIS — C44519 Basal cell carcinoma of skin of other part of trunk: Secondary | ICD-10-CM | POA: Diagnosis not present

## 2022-10-03 DIAGNOSIS — C44311 Basal cell carcinoma of skin of nose: Secondary | ICD-10-CM | POA: Diagnosis not present

## 2022-10-23 IMAGING — US US RENAL
1 series · 14 of 25 positions shown · non-contrast
Comparison: None.

CLINICAL DATA: Acute kidney failure

EXAM:
RENAL / URINARY TRACT ULTRASOUND COMPLETE

[Series 1: us renal · 0.25mm/px · 14 of 46 slices shown]
[im 1/46]
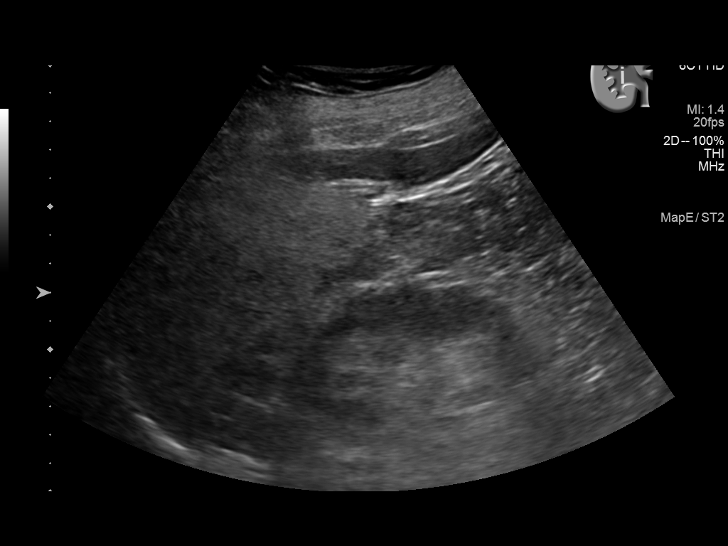
[im 4/46]
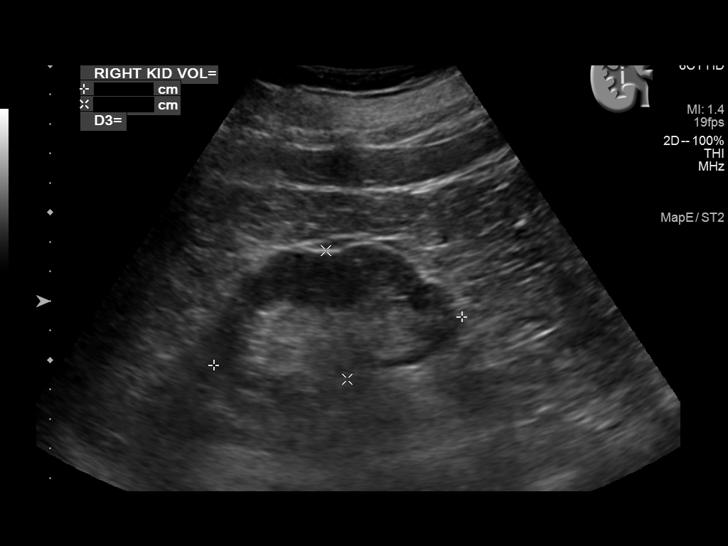
[im 8/46]
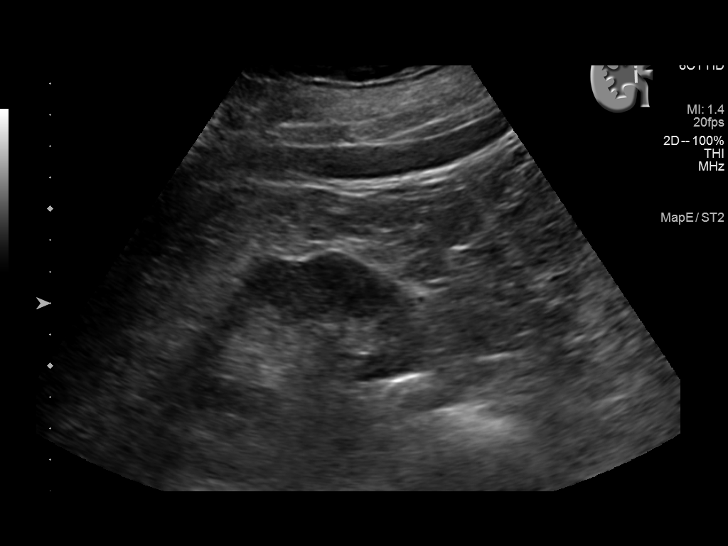
[im 12/46]
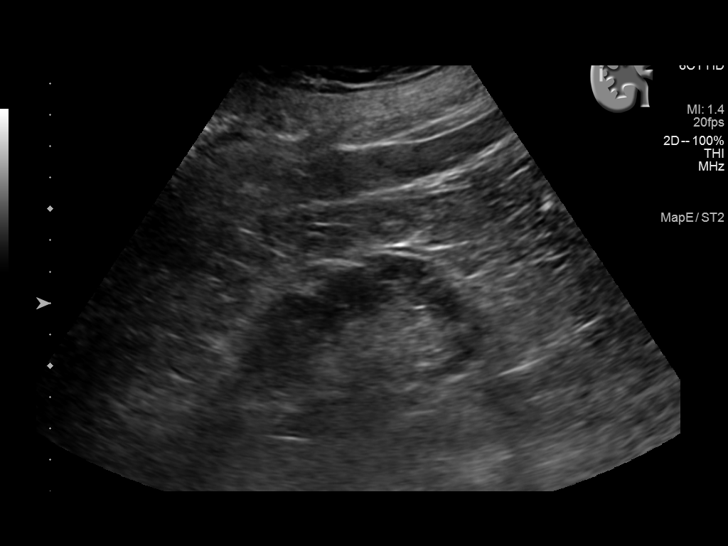
[im 16/46]
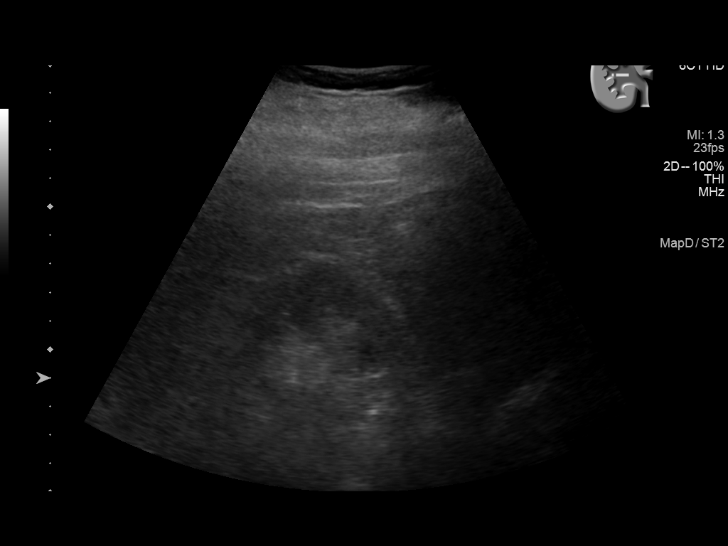
[im 17/46]
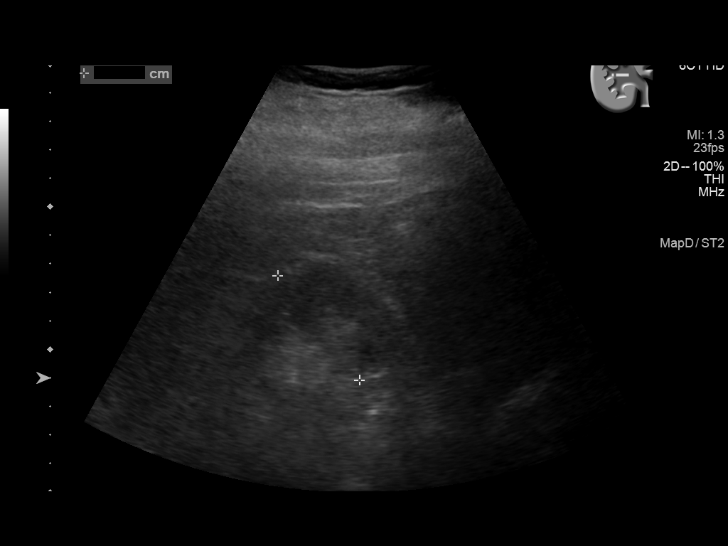
[im 21/46]
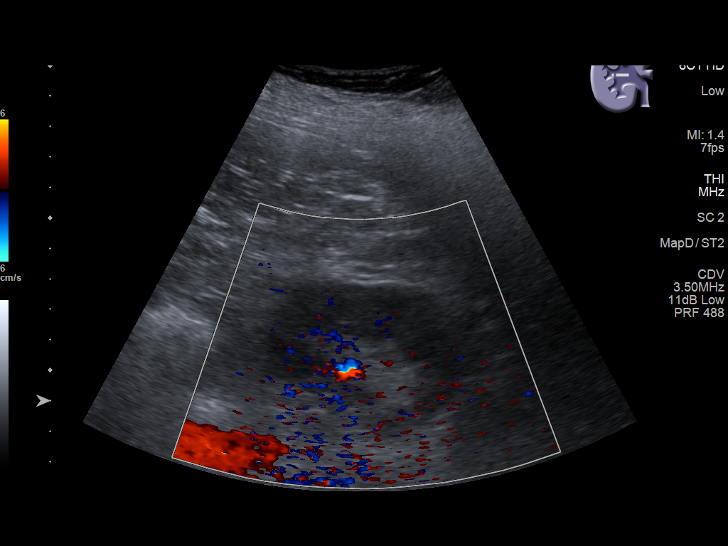
[im 25/46]
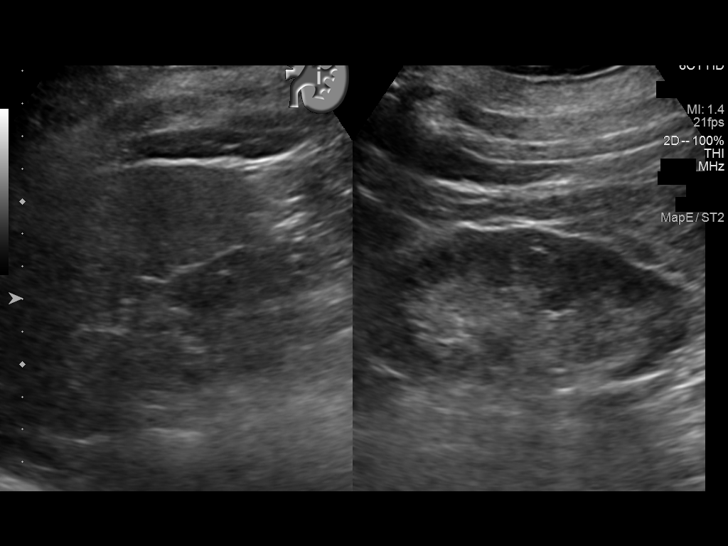
[im 29/46]
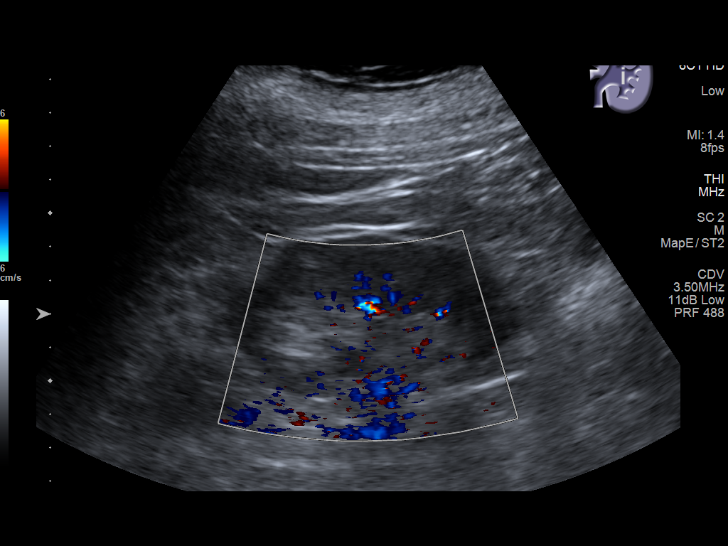
[im 31/46]
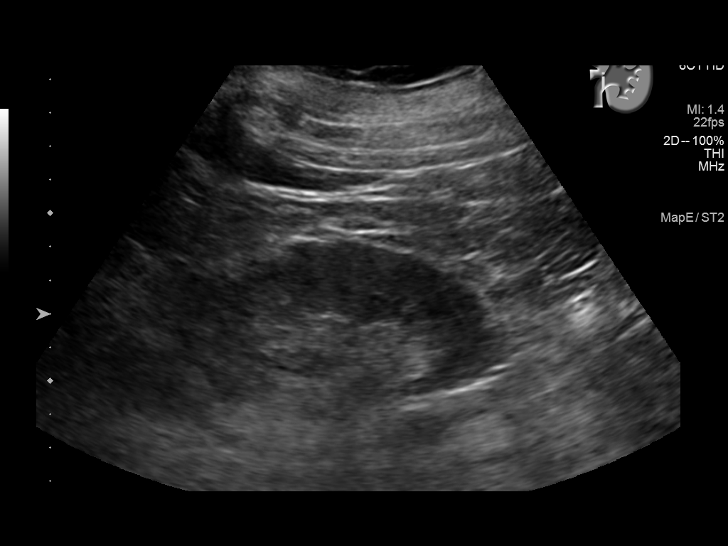
[im 34/46]
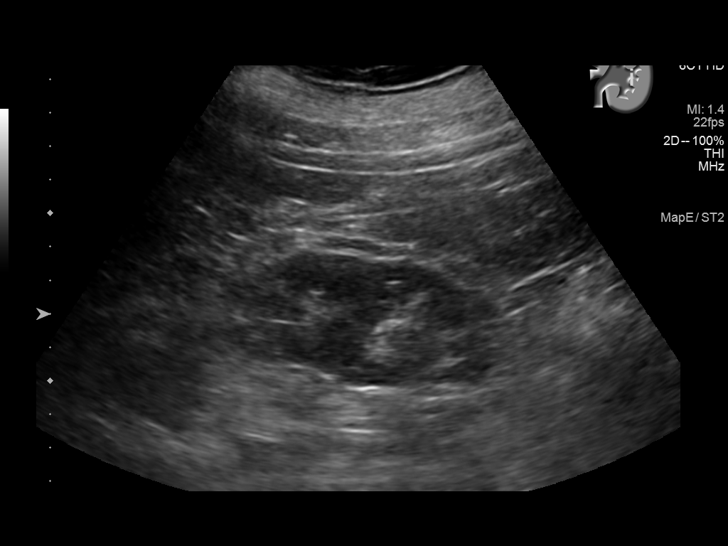
[im 38/46]
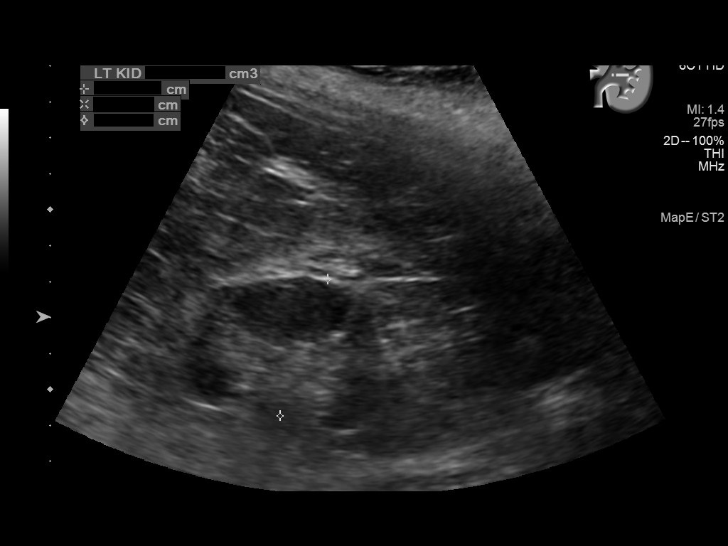
[im 42/46]
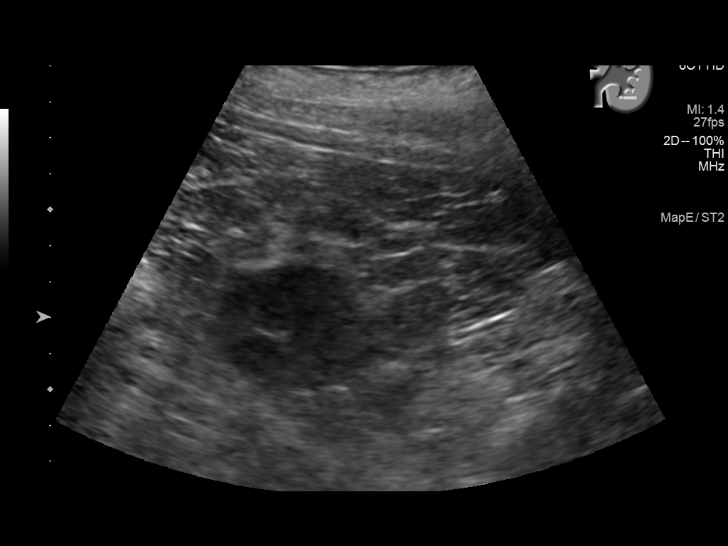
[im 46/46]
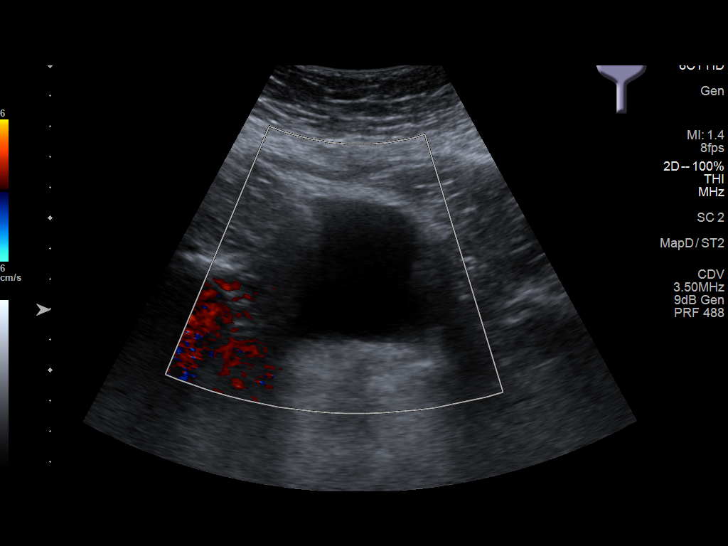

[14 of 25 positions shown; findings below may reference images not displayed]

FINDINGS: Right Kidney:

Renal measurements: 8.6 x 4.4 x 4.7 cm = volume: 92.1 mL.
Echogenicity within normal limits. No mass or hydronephrosis
visualized.

Left Kidney:

Renal measurements: 10.1 x 5.3 x 4 cm = volume: 112.9 mL.
Echogenicity within normal limits. No mass or hydronephrosis
visualized.

Bladder:

Appears normal for degree of bladder distention.

Other:

None.
IMPRESSION: Negative examination

## 2022-10-30 DIAGNOSIS — I10 Essential (primary) hypertension: Secondary | ICD-10-CM | POA: Diagnosis not present

## 2022-10-30 DIAGNOSIS — E039 Hypothyroidism, unspecified: Secondary | ICD-10-CM | POA: Diagnosis not present

## 2022-10-30 DIAGNOSIS — E782 Mixed hyperlipidemia: Secondary | ICD-10-CM | POA: Diagnosis not present

## 2022-11-06 DIAGNOSIS — N1831 Chronic kidney disease, stage 3a: Secondary | ICD-10-CM | POA: Diagnosis not present

## 2022-11-06 DIAGNOSIS — I129 Hypertensive chronic kidney disease with stage 1 through stage 4 chronic kidney disease, or unspecified chronic kidney disease: Secondary | ICD-10-CM | POA: Diagnosis not present

## 2022-11-06 DIAGNOSIS — N179 Acute kidney failure, unspecified: Secondary | ICD-10-CM | POA: Diagnosis not present

## 2022-11-06 DIAGNOSIS — E039 Hypothyroidism, unspecified: Secondary | ICD-10-CM | POA: Diagnosis not present

## 2022-11-06 DIAGNOSIS — R7303 Prediabetes: Secondary | ICD-10-CM | POA: Diagnosis not present

## 2022-11-29 DIAGNOSIS — I10 Essential (primary) hypertension: Secondary | ICD-10-CM | POA: Diagnosis not present

## 2022-11-29 DIAGNOSIS — N1831 Chronic kidney disease, stage 3a: Secondary | ICD-10-CM | POA: Diagnosis not present

## 2022-11-29 DIAGNOSIS — R7303 Prediabetes: Secondary | ICD-10-CM | POA: Diagnosis not present

## 2023-01-15 DIAGNOSIS — D2272 Melanocytic nevi of left lower limb, including hip: Secondary | ICD-10-CM | POA: Diagnosis not present

## 2023-01-15 DIAGNOSIS — C44719 Basal cell carcinoma of skin of left lower limb, including hip: Secondary | ICD-10-CM | POA: Diagnosis not present

## 2023-01-15 DIAGNOSIS — D2239 Melanocytic nevi of other parts of face: Secondary | ICD-10-CM | POA: Diagnosis not present

## 2023-01-15 DIAGNOSIS — D485 Neoplasm of uncertain behavior of skin: Secondary | ICD-10-CM | POA: Diagnosis not present

## 2023-01-15 DIAGNOSIS — Z85828 Personal history of other malignant neoplasm of skin: Secondary | ICD-10-CM | POA: Diagnosis not present

## 2023-01-15 DIAGNOSIS — D2262 Melanocytic nevi of left upper limb, including shoulder: Secondary | ICD-10-CM | POA: Diagnosis not present

## 2023-01-15 DIAGNOSIS — C44712 Basal cell carcinoma of skin of right lower limb, including hip: Secondary | ICD-10-CM | POA: Diagnosis not present

## 2023-01-15 DIAGNOSIS — D2261 Melanocytic nevi of right upper limb, including shoulder: Secondary | ICD-10-CM | POA: Diagnosis not present

## 2023-01-15 DIAGNOSIS — D225 Melanocytic nevi of trunk: Secondary | ICD-10-CM | POA: Diagnosis not present

## 2023-03-14 DIAGNOSIS — H2513 Age-related nuclear cataract, bilateral: Secondary | ICD-10-CM | POA: Diagnosis not present

## 2023-03-19 DIAGNOSIS — C44712 Basal cell carcinoma of skin of right lower limb, including hip: Secondary | ICD-10-CM | POA: Diagnosis not present

## 2023-03-19 DIAGNOSIS — C44719 Basal cell carcinoma of skin of left lower limb, including hip: Secondary | ICD-10-CM | POA: Diagnosis not present

## 2023-03-26 DIAGNOSIS — U071 COVID-19: Secondary | ICD-10-CM | POA: Diagnosis not present

## 2023-03-26 DIAGNOSIS — J069 Acute upper respiratory infection, unspecified: Secondary | ICD-10-CM | POA: Diagnosis not present

## 2023-04-08 DIAGNOSIS — Z23 Encounter for immunization: Secondary | ICD-10-CM | POA: Diagnosis not present

## 2023-04-08 DIAGNOSIS — M79675 Pain in left toe(s): Secondary | ICD-10-CM | POA: Diagnosis not present

## 2023-04-15 DIAGNOSIS — C44311 Basal cell carcinoma of skin of nose: Secondary | ICD-10-CM | POA: Diagnosis not present

## 2023-04-18 DIAGNOSIS — N183 Chronic kidney disease, stage 3 unspecified: Secondary | ICD-10-CM | POA: Diagnosis not present

## 2023-04-18 DIAGNOSIS — I10 Essential (primary) hypertension: Secondary | ICD-10-CM | POA: Diagnosis not present

## 2023-05-06 DIAGNOSIS — E039 Hypothyroidism, unspecified: Secondary | ICD-10-CM | POA: Diagnosis not present

## 2023-05-06 DIAGNOSIS — R7303 Prediabetes: Secondary | ICD-10-CM | POA: Diagnosis not present

## 2023-05-06 DIAGNOSIS — N1831 Chronic kidney disease, stage 3a: Secondary | ICD-10-CM | POA: Diagnosis not present

## 2023-05-06 DIAGNOSIS — I10 Essential (primary) hypertension: Secondary | ICD-10-CM | POA: Diagnosis not present

## 2023-05-09 DIAGNOSIS — M109 Gout, unspecified: Secondary | ICD-10-CM | POA: Diagnosis not present

## 2023-05-13 ENCOUNTER — Other Ambulatory Visit: Payer: Self-pay | Admitting: Internal Medicine

## 2023-05-13 DIAGNOSIS — E039 Hypothyroidism, unspecified: Secondary | ICD-10-CM | POA: Diagnosis not present

## 2023-05-13 DIAGNOSIS — M1A00X0 Idiopathic chronic gout, unspecified site, without tophus (tophi): Secondary | ICD-10-CM | POA: Diagnosis not present

## 2023-05-13 DIAGNOSIS — N1831 Chronic kidney disease, stage 3a: Secondary | ICD-10-CM | POA: Diagnosis not present

## 2023-05-13 DIAGNOSIS — Z1389 Encounter for screening for other disorder: Secondary | ICD-10-CM | POA: Diagnosis not present

## 2023-05-13 DIAGNOSIS — Z1231 Encounter for screening mammogram for malignant neoplasm of breast: Secondary | ICD-10-CM

## 2023-05-13 DIAGNOSIS — Z0000 Encounter for general adult medical examination without abnormal findings: Secondary | ICD-10-CM | POA: Diagnosis not present

## 2023-05-13 DIAGNOSIS — I129 Hypertensive chronic kidney disease with stage 1 through stage 4 chronic kidney disease, or unspecified chronic kidney disease: Secondary | ICD-10-CM | POA: Diagnosis not present

## 2023-05-13 DIAGNOSIS — E782 Mixed hyperlipidemia: Secondary | ICD-10-CM | POA: Diagnosis not present

## 2023-05-15 DIAGNOSIS — M5442 Lumbago with sciatica, left side: Secondary | ICD-10-CM | POA: Diagnosis not present

## 2023-05-15 DIAGNOSIS — M5410 Radiculopathy, site unspecified: Secondary | ICD-10-CM | POA: Diagnosis not present

## 2023-06-03 ENCOUNTER — Other Ambulatory Visit: Payer: Self-pay | Admitting: Internal Medicine

## 2023-06-03 DIAGNOSIS — G8929 Other chronic pain: Secondary | ICD-10-CM

## 2023-06-04 ENCOUNTER — Ambulatory Visit: Admission: RE | Admit: 2023-06-04 | Payer: Medicare Other | Source: Ambulatory Visit

## 2023-07-21 DIAGNOSIS — N183 Chronic kidney disease, stage 3 unspecified: Secondary | ICD-10-CM | POA: Diagnosis not present

## 2023-07-22 DIAGNOSIS — M5442 Lumbago with sciatica, left side: Secondary | ICD-10-CM | POA: Diagnosis not present

## 2023-07-22 DIAGNOSIS — N1831 Chronic kidney disease, stage 3a: Secondary | ICD-10-CM | POA: Diagnosis not present

## 2023-07-22 DIAGNOSIS — M1A9XX0 Chronic gout, unspecified, without tophus (tophi): Secondary | ICD-10-CM | POA: Diagnosis not present

## 2023-07-22 DIAGNOSIS — G8929 Other chronic pain: Secondary | ICD-10-CM | POA: Diagnosis not present

## 2023-07-22 DIAGNOSIS — E039 Hypothyroidism, unspecified: Secondary | ICD-10-CM | POA: Diagnosis not present

## 2023-07-22 DIAGNOSIS — I129 Hypertensive chronic kidney disease with stage 1 through stage 4 chronic kidney disease, or unspecified chronic kidney disease: Secondary | ICD-10-CM | POA: Diagnosis not present

## 2023-07-22 DIAGNOSIS — M1A00X0 Idiopathic chronic gout, unspecified site, without tophus (tophi): Secondary | ICD-10-CM | POA: Diagnosis not present

## 2023-08-04 DIAGNOSIS — M5442 Lumbago with sciatica, left side: Secondary | ICD-10-CM | POA: Diagnosis not present

## 2023-08-04 DIAGNOSIS — N1832 Chronic kidney disease, stage 3b: Secondary | ICD-10-CM | POA: Diagnosis not present

## 2023-08-22 DIAGNOSIS — I10 Essential (primary) hypertension: Secondary | ICD-10-CM | POA: Diagnosis not present

## 2023-08-22 DIAGNOSIS — N1831 Chronic kidney disease, stage 3a: Secondary | ICD-10-CM | POA: Diagnosis not present

## 2023-08-26 DIAGNOSIS — I10 Essential (primary) hypertension: Secondary | ICD-10-CM | POA: Diagnosis not present

## 2023-08-26 DIAGNOSIS — N1831 Chronic kidney disease, stage 3a: Secondary | ICD-10-CM | POA: Diagnosis not present

## 2023-09-08 DIAGNOSIS — J069 Acute upper respiratory infection, unspecified: Secondary | ICD-10-CM | POA: Diagnosis not present

## 2023-09-08 DIAGNOSIS — Z03818 Encounter for observation for suspected exposure to other biological agents ruled out: Secondary | ICD-10-CM | POA: Diagnosis not present

## 2023-09-08 DIAGNOSIS — R6889 Other general symptoms and signs: Secondary | ICD-10-CM | POA: Diagnosis not present

## 2023-09-18 DIAGNOSIS — H65193 Other acute nonsuppurative otitis media, bilateral: Secondary | ICD-10-CM | POA: Diagnosis not present

## 2023-09-18 DIAGNOSIS — I10 Essential (primary) hypertension: Secondary | ICD-10-CM | POA: Diagnosis not present

## 2023-10-22 DIAGNOSIS — I129 Hypertensive chronic kidney disease with stage 1 through stage 4 chronic kidney disease, or unspecified chronic kidney disease: Secondary | ICD-10-CM | POA: Diagnosis not present

## 2023-10-22 DIAGNOSIS — N1832 Chronic kidney disease, stage 3b: Secondary | ICD-10-CM | POA: Diagnosis not present

## 2023-10-22 DIAGNOSIS — M25511 Pain in right shoulder: Secondary | ICD-10-CM | POA: Diagnosis not present

## 2024-03-17 DIAGNOSIS — Z03818 Encounter for observation for suspected exposure to other biological agents ruled out: Secondary | ICD-10-CM | POA: Diagnosis not present
